# Patient Record
Sex: Male | Born: 1965
Health system: Southern US, Community
[De-identification: ages and names within clinical notes are randomized; demographics above are authoritative.]

## PROBLEM LIST (undated history)

## (undated) DIAGNOSIS — N410 Acute prostatitis: Secondary | ICD-10-CM

## (undated) DIAGNOSIS — E785 Hyperlipidemia, unspecified: Secondary | ICD-10-CM

## (undated) HISTORY — DX: Hyperlipidemia, unspecified: E78.5

## (undated) HISTORY — PX: NO PAST SURGERIES: SHX2092

## (undated) HISTORY — DX: Acute prostatitis: N41.0

---

## 2004-07-07 ENCOUNTER — Ambulatory Visit: Payer: Self-pay | Admitting: Family Medicine

## 2005-02-23 ENCOUNTER — Ambulatory Visit: Payer: Self-pay | Admitting: Family Medicine

## 2005-04-27 ENCOUNTER — Ambulatory Visit: Payer: Self-pay | Admitting: Family Medicine

## 2005-05-08 ENCOUNTER — Ambulatory Visit: Payer: Self-pay | Admitting: Family Medicine

## 2006-07-26 ENCOUNTER — Ambulatory Visit: Payer: Self-pay | Admitting: Family Medicine

## 2006-09-12 DIAGNOSIS — E785 Hyperlipidemia, unspecified: Secondary | ICD-10-CM

## 2006-09-12 HISTORY — DX: Hyperlipidemia, unspecified: E78.5

## 2006-10-04 ENCOUNTER — Ambulatory Visit: Payer: Self-pay | Admitting: Family Medicine

## 2006-10-04 LAB — CONVERTED CEMR LAB
ALT: 41 units/L (ref 0–53)
AST: 29 units/L (ref 0–37)
Basophils Relative: 0 % (ref 0.0–1.0)
Bilirubin, Direct: 0.1 mg/dL (ref 0.0–0.3)
CO2: 27 meq/L (ref 19–32)
Calcium: 8.9 mg/dL (ref 8.4–10.5)
Chloride: 108 meq/L (ref 96–112)
Creatinine, Ser: 1 mg/dL (ref 0.4–1.5)
Direct LDL: 182.6 mg/dL
Eosinophils Absolute: 0.1 10*3/uL (ref 0.0–0.6)
Eosinophils Relative: 2.2 % (ref 0.0–5.0)
GFR calc non Af Amer: 88 mL/min
Glucose, Bld: 97 mg/dL (ref 70–99)
HCT: 40.9 % (ref 39.0–52.0)
HDL: 37.8 mg/dL — ABNORMAL LOW (ref >39.0)
MCV: 84.9 fL (ref 78.0–100.0)
Neutrophils Relative %: 46.8 % (ref 43.0–77.0)
Nitrite: NEGATIVE
Platelets: 274 10*3/uL (ref 150–400)
RBC: 4.82 M/uL (ref 4.22–5.81)
Sodium: 142 meq/L (ref 135–145)
Specific Gravity, Urine: 1.015
Total Bilirubin: 0.8 mg/dL (ref 0.3–1.2)
Total Protein: 6.6 g/dL (ref 6.0–8.3)
Triglycerides: 132 mg/dL (ref 0–149)
Urobilinogen, UA: 0.2
VLDL: 26 mg/dL (ref 0–40)
WBC Urine, dipstick: NEGATIVE
WBC: 6.8 10*3/uL (ref 4.5–10.5)

## 2006-10-11 ENCOUNTER — Ambulatory Visit: Payer: Self-pay | Admitting: Family Medicine

## 2006-10-11 DIAGNOSIS — R319 Hematuria, unspecified: Secondary | ICD-10-CM | POA: Insufficient documentation

## 2008-12-03 ENCOUNTER — Ambulatory Visit: Payer: Self-pay | Admitting: Family Medicine

## 2008-12-03 LAB — CONVERTED CEMR LAB
ALT: 23 units/L (ref 0–53)
Albumin: 4 g/dL (ref 3.5–5.2)
Alkaline Phosphatase: 48 units/L (ref 39–117)
Basophils Relative: 0.3 % (ref 0.0–3.0)
Bilirubin Urine: NEGATIVE
Bilirubin, Direct: 0.1 mg/dL (ref 0.0–0.3)
CO2: 28 meq/L (ref 19–32)
Calcium: 9.2 mg/dL (ref 8.4–10.5)
Chloride: 108 meq/L (ref 96–112)
Cholesterol: 266 mg/dL — ABNORMAL HIGH (ref 0–200)
Creatinine, Ser: 1.1 mg/dL (ref 0.4–1.5)
Eosinophils Relative: 2 % (ref 0.0–5.0)
Hemoglobin: 14.4 g/dL (ref 13.0–17.0)
Lymphocytes Relative: 46.5 % — ABNORMAL HIGH (ref 12.0–46.0)
MCV: 89.9 fL (ref 78.0–100.0)
Neutro Abs: 2.2 10*3/uL (ref 1.4–7.7)
Neutrophils Relative %: 42.8 % — ABNORMAL LOW (ref 43.0–77.0)
Nitrite: NEGATIVE
Protein, U semiquant: NEGATIVE
RBC: 4.83 M/uL (ref 4.22–5.81)
Sodium: 144 meq/L (ref 135–145)
Total CHOL/HDL Ratio: 4
Total Protein: 7.1 g/dL (ref 6.0–8.3)
Urobilinogen, UA: 0.2
VLDL: 14.6 mg/dL (ref 0.0–40.0)
WBC: 5.2 10*3/uL (ref 4.5–10.5)

## 2008-12-17 ENCOUNTER — Ambulatory Visit: Payer: Self-pay | Admitting: Family Medicine

## 2011-06-18 ENCOUNTER — Ambulatory Visit (INDEPENDENT_AMBULATORY_CARE_PROVIDER_SITE_OTHER): Payer: BC Managed Care – PPO | Admitting: Family Medicine

## 2011-06-18 ENCOUNTER — Encounter: Payer: Self-pay | Admitting: Family Medicine

## 2011-06-18 VITALS — BP 110/74 | Temp 98.8°F | Ht 60.0 in | Wt 136.0 lb

## 2011-06-18 DIAGNOSIS — R300 Dysuria: Secondary | ICD-10-CM

## 2011-06-18 DIAGNOSIS — N410 Acute prostatitis: Secondary | ICD-10-CM

## 2011-06-18 HISTORY — DX: Acute prostatitis: N41.0

## 2011-06-18 LAB — POCT URINALYSIS DIPSTICK
Bilirubin, UA: NEGATIVE
Ketones, UA: NEGATIVE
Spec Grav, UA: >=1.03
pH, UA: 6

## 2011-06-18 MED ORDER — CIPROFLOXACIN HCL 500 MG PO TABS: 500.0000 mg | ORAL_TABLET | Freq: Two times a day (BID) | ORAL | Status: AC

## 2011-06-18 NOTE — Patient Instructions (Signed)
Drink lots of water  Take the Cipro 1 twice daily until bottle empty refills x1

## 2011-06-18 NOTE — Progress Notes (Signed)
  Subjective:    Patient ID: Curtis Franklin, male    DOB: [birth year], 46 y.o.   MRN: [id number]  HPI Curtis Franklin is a 46 year old male who comes in today for evaluation of fever chills and dysuria  He states he felt well until last Friday when he began having diffuse abdominal pain fever and chills. On Saturday he began having frequency and burning on urination. Review of systems otherwise negative he did have urinary tract infection but it's been 10-15 years ago. No discharge no testicular pain   Review of Systems General and urologic review of systems negative    Objective:   Physical Exam Well-developed well-nourished male in no acute distress examination the abdomen is normal  Urinalysis shows white cells and bacteria consistent with acute prostatitis       Assessment & Plan:  Acute prostatitis plan Cipro 500 twice a day for 10 days

## 2011-06-22 ENCOUNTER — Telehealth: Payer: Self-pay | Admitting: Family Medicine

## 2011-06-22 NOTE — Telephone Encounter (Signed)
Spoke to Curtis Franklin about headaches. Told him headaches are a possible side effect of Cipro, he stated that the headaches are not bad and they come and go but not all the time. Curtis Franklin is not taking any other medications. I told him I could schedule him an appointment with Dr. Tawanna Cooler, next week. He said he will wait and see how he feels once he finished the Cipro.

## 2011-06-22 NOTE — Telephone Encounter (Signed)
Pt was in to see Dr Tawanna Cooler re: uti and was prescribed a med. UTI has improved, but now pt is having periodic headaches. Req call back from nurse.

## 2011-07-18 ENCOUNTER — Encounter: Payer: Self-pay | Admitting: Family Medicine

## 2011-07-18 ENCOUNTER — Ambulatory Visit (INDEPENDENT_AMBULATORY_CARE_PROVIDER_SITE_OTHER): Payer: BC Managed Care – PPO | Admitting: Family Medicine

## 2011-07-18 VITALS — BP 130/68 | Temp 98.4°F | Wt 134.0 lb

## 2011-07-18 DIAGNOSIS — R300 Dysuria: Secondary | ICD-10-CM

## 2011-07-18 LAB — POCT URINALYSIS DIPSTICK
Bilirubin, UA: NEGATIVE
Glucose, UA: NEGATIVE
Ketones, UA: NEGATIVE
Nitrite, UA: NEGATIVE
Spec Grav, UA: >=1.03

## 2011-07-18 MED ORDER — CEPHALEXIN 500 MG PO CAPS: 500.0000 mg | ORAL_CAPSULE | Freq: Three times a day (TID) | ORAL | Status: AC

## 2011-07-18 NOTE — Patient Instructions (Addendum)

## 2011-07-18 NOTE — Progress Notes (Signed)
  Subjective:    Patient ID: Curtis Franklin, male    DOB: [birth year], 46 y.o.   MRN: [id number]  HPI  Recent UTI.  On Cipro. Symptoms improved but never fully went away. He is monogamous. No penile discharge. Urine frequency and mild burning. No fever. No back pain. No nausea or vomiting. He also relates somewhat slow urinary stream even prior to this symptom but no severe obstruction. No prior history of recurrent UTI. No known drug allergies.   Review of Systems  Constitutional: Negative for fever, chills, appetite change and unexpected weight change.  Genitourinary: Positive for dysuria. Negative for hematuria and testicular pain.       Objective:   Physical Exam  Constitutional: He appears well-developed and well-nourished.  Cardiovascular: Normal rate and regular rhythm.   Pulmonary/Chest: Effort normal and breath sounds normal. No respiratory distress. He has no wheezes. He has no rales.          Assessment & Plan:  Dysuria. Question recurrent UTI.  Urine cx sent.  Keflex pending culture.  No risk factors for STD.

## 2011-07-21 LAB — URINE CULTURE: Colony Count: >=100000

## 2011-07-23 NOTE — Progress Notes (Signed)
Quick Note:  Pt informed ______ 

## 2011-08-03 ENCOUNTER — Encounter: Payer: Self-pay | Admitting: Family Medicine

## 2011-08-03 ENCOUNTER — Ambulatory Visit (INDEPENDENT_AMBULATORY_CARE_PROVIDER_SITE_OTHER): Payer: BC Managed Care – PPO | Admitting: Family Medicine

## 2011-08-03 VITALS — BP 130/84 | Temp 98.4°F | Wt 133.0 lb

## 2011-08-03 DIAGNOSIS — R319 Hematuria, unspecified: Secondary | ICD-10-CM

## 2011-08-03 DIAGNOSIS — R300 Dysuria: Secondary | ICD-10-CM

## 2011-08-03 LAB — POCT URINALYSIS DIPSTICK
Bilirubin, UA: NEGATIVE
Glucose, UA: NEGATIVE
Leukocytes, UA: NEGATIVE
Nitrite, UA: NEGATIVE

## 2011-08-03 LAB — PSA: PSA: 7.59 ng/mL — ABNORMAL HIGH (ref 0.10–4.00)

## 2011-08-03 NOTE — Progress Notes (Signed)
  Subjective:    Patient ID: Curtis Franklin, male    DOB: 11-Jun-1965, 46 y.o.   MRN: [id number]  HPI  Patient seen for followup UTI.  Grew out Escherichia coli. Susceptible to Keflex. Patient completed antibiotics. No fever or chills. No dysuria this time other than his had some chronic slightly slow stream -preceding UTI. Denies any history of urethritis. We discussed possible PSA testing because of chronic slow stream intermittently.   Review of Systems  Constitutional: Negative for fever and chills.  Genitourinary: Positive for dysuria. Negative for hematuria.       Objective:   Physical Exam  Constitutional: He appears well-developed and well-nourished.  Cardiovascular: Normal rate and regular rhythm.   Pulmonary/Chest: Effort normal and breath sounds normal. No respiratory distress. He has no wheezes. He has no rales.          Assessment & Plan:  Dysuria. Recent UTI. Those acute symptoms have improved. Recheck urinalysis. He has had chronic slightly slow stream. Patient refuses prostate digital exam. PSA obtained per his request though explained complete examination should include digital exam. If PSA normal no further evaluation unless symptoms progress  Repeat urine dip still shows blood.  Urine micro and if > 3 RBCs per HPF would recommend urology referral.

## 2011-08-03 NOTE — Patient Instructions (Addendum)
Followup immediately for any fever or recurrent burning with urination

## 2011-08-04 LAB — URINALYSIS, MICROSCOPIC ONLY
Crystals: NONE SEEN
Squamous Epithelial / LPF: NONE SEEN

## 2011-08-10 ENCOUNTER — Ambulatory Visit: Payer: BC Managed Care – PPO | Admitting: Family Medicine

## 2011-09-07 ENCOUNTER — Other Ambulatory Visit (INDEPENDENT_AMBULATORY_CARE_PROVIDER_SITE_OTHER): Payer: BC Managed Care – PPO

## 2011-09-07 DIAGNOSIS — R972 Elevated prostate specific antigen [PSA]: Secondary | ICD-10-CM

## 2011-09-14 ENCOUNTER — Encounter: Payer: Self-pay | Admitting: Family Medicine

## 2011-09-14 ENCOUNTER — Telehealth: Payer: Self-pay | Admitting: *Deleted

## 2011-09-14 ENCOUNTER — Ambulatory Visit (INDEPENDENT_AMBULATORY_CARE_PROVIDER_SITE_OTHER): Payer: BC Managed Care – PPO | Admitting: Family Medicine

## 2011-09-14 VITALS — BP 126/70 | Temp 98.4°F | Wt 132.0 lb

## 2011-09-14 DIAGNOSIS — R972 Elevated prostate specific antigen [PSA]: Secondary | ICD-10-CM

## 2011-09-14 NOTE — Telephone Encounter (Signed)
Pt requesting change PCP due to scheduling conflict with Dr Tawanna Cooler.  Pt has Fridays off work and convenient for him to make appts. Pt requesting Dr Caryl Never as PCP

## 2011-09-14 NOTE — Progress Notes (Signed)
  Subjective:    Patient ID: Curtis Franklin, male    DOB: Aug 06, 1965, 46 y.o.   MRN: [id number]  HPI  Patient seen for followup. He travels Monday through Thursday and is only here in town Fridays which is the day his primary care physician is off.  Recent issues with dysuria. Treated with Cipro. Had small blood on urine dipstick. Subsequent urine micro-revealed no significant red blood cells. Patient requested PSA and this came back initially high 7.5. Repeat one month later 4.3 which suggests that he may have had recent prostatitis. No urinary symptoms at this time. No gross hematuria. No burning. No fever. No chills.   Review of Systems  Constitutional: Negative for appetite change and unexpected weight change.  Genitourinary: Negative for dysuria and hematuria.  Musculoskeletal: Negative for back pain.       Objective:   Physical Exam  Constitutional: He appears well-developed and well-nourished.  Cardiovascular: Normal rate and regular rhythm.   Genitourinary: Rectum normal and prostate normal.          Assessment & Plan:  #1 recent small hematuria on urine dipstick with subsequent negative urine micro-. Symptomatically improved following Cipro #2 elevated PSA. Question recent prostatitis. Repeat PSA still elevated but down. We have suggested complete physical in 3 months and repeat PSA once more that time. Patient requesting Friday appointment because of conflict with work schedule.  Never in town Monday to Thursday.  This is why he has not seen his primary last 3 visits.

## 2011-09-19 NOTE — Telephone Encounter (Signed)
ok 

## 2012-05-30 ENCOUNTER — Other Ambulatory Visit (INDEPENDENT_AMBULATORY_CARE_PROVIDER_SITE_OTHER): Payer: BC Managed Care – PPO

## 2012-05-30 DIAGNOSIS — Z0000 Encounter for general adult medical examination without abnormal findings: Secondary | ICD-10-CM

## 2012-05-30 LAB — LIPID PANEL
HDL: 53.3 mg/dL (ref >39.00)
Triglycerides: 176 mg/dL — ABNORMAL HIGH (ref 0.0–149.0)

## 2012-05-30 LAB — BASIC METABOLIC PANEL
BUN: 13 mg/dL (ref 6–23)
CO2: 26 mEq/L (ref 19–32)
Chloride: 106 mEq/L (ref 96–112)
Glucose, Bld: 87 mg/dL (ref 70–99)
Potassium: 4.4 mEq/L (ref 3.5–5.1)

## 2012-05-30 LAB — LDL CHOLESTEROL, DIRECT: Direct LDL: 165.9 mg/dL

## 2012-05-30 LAB — POCT URINALYSIS DIPSTICK
Bilirubin, UA: NEGATIVE
Glucose, UA: NEGATIVE
Nitrite, UA: NEGATIVE
Urobilinogen, UA: 0.2
pH, UA: 8

## 2012-05-30 LAB — TSH: TSH: 1.24 u[IU]/mL (ref 0.35–5.50)

## 2012-05-30 LAB — CBC WITH DIFFERENTIAL/PLATELET
Basophils Absolute: 0 10*3/uL (ref 0.0–0.1)
HCT: 43.9 % (ref 39.0–52.0)
Lymphs Abs: 2.8 10*3/uL (ref 0.7–4.0)
MCHC: 33.6 g/dL (ref 30.0–36.0)
MCV: 86.1 fl (ref 78.0–100.0)
Monocytes Absolute: 0.6 10*3/uL (ref 0.1–1.0)
Platelets: 217 10*3/uL (ref 150.0–400.0)
RDW: 12.9 % (ref 11.5–14.6)

## 2012-05-30 LAB — HEPATIC FUNCTION PANEL
Albumin: 3.7 g/dL (ref 3.5–5.2)
Total Bilirubin: 0.5 mg/dL (ref 0.3–1.2)

## 2012-06-06 ENCOUNTER — Ambulatory Visit (INDEPENDENT_AMBULATORY_CARE_PROVIDER_SITE_OTHER): Payer: BC Managed Care – PPO | Admitting: Family Medicine

## 2012-06-06 ENCOUNTER — Encounter: Payer: Self-pay | Admitting: Family Medicine

## 2012-06-06 VITALS — BP 130/80 | Temp 98.9°F | Ht 60.0 in | Wt 140.0 lb

## 2012-06-06 DIAGNOSIS — Z0000 Encounter for general adult medical examination without abnormal findings: Secondary | ICD-10-CM

## 2012-06-06 NOTE — Progress Notes (Signed)
  Subjective:    Patient ID: Curtis Franklin, male    DOB: 1965/05/23, 47 y.o.   MRN: [id number]  HPI Patient is here for complete physical. Generally very healthy. Still runs few days per week. Takes no chronic medications. Tetanus 2005. Prior history of acute prostatitis. No recent urinary symptoms. No specific complaints. Patient nonsmoker. Does have history of hyperlipidemia but overall low risk for CAD. Previously on statin but he took himself off  Past Medical History  Diagnosis Date  . HYPERLIPIDEMIA 09/12/2006    Qualifier: Diagnosis of  By: Everett Graff    . Prostatitis, acute 06/18/2011   No past surgical history on file.  reports that he has quit smoking. He does not have any smokeless tobacco history on file. He reports that  drinks alcohol. He reports that he does not use illicit drugs. family history includes Hyperlipidemia in his other. Not on File    Review of Systems  Constitutional: Negative for fever, activity change, appetite change, fatigue and unexpected weight change.  HENT: Negative for ear pain, congestion and trouble swallowing.   Eyes: Negative for pain and visual disturbance.  Respiratory: Negative for cough, shortness of breath and wheezing.   Cardiovascular: Negative for chest pain and palpitations.  Gastrointestinal: Negative for nausea, vomiting, abdominal pain, diarrhea, constipation, blood in stool, abdominal distention and rectal pain.  Genitourinary: Negative for dysuria, hematuria and testicular pain.  Musculoskeletal: Negative for joint swelling and arthralgias.  Skin: Negative for rash.  Neurological: Negative for dizziness, syncope and headaches.  Hematological: Negative for adenopathy.  Psychiatric/Behavioral: Negative for confusion and dysphoric mood.       Objective:   Physical Exam  Constitutional: He is oriented to person, place, and time. He appears well-developed and well-nourished. No distress.  HENT:  Head: Normocephalic  and atraumatic.  Right Ear: External ear normal.  Left Ear: External ear normal.  Mouth/Throat: Oropharynx is clear and moist.  Eyes: Conjunctivae and EOM are normal. Pupils are equal, round, and reactive to light.  Neck: Normal range of motion. Neck supple. No thyromegaly present.  Cardiovascular: Normal rate, regular rhythm and normal heart sounds.   No murmur heard. Pulmonary/Chest: No respiratory distress. He has no wheezes. He has no rales.  Abdominal: Soft. Bowel sounds are normal. He exhibits no distension and no mass. There is no tenderness. There is no rebound and no guarding.  Musculoskeletal: He exhibits no edema.  Lymphadenopathy:    He has no cervical adenopathy.  Neurological: He is alert and oriented to person, place, and time. He displays normal reflexes. No cranial nerve deficit.  Skin: No rash noted.  Psychiatric: He has a normal mood and affect.          Assessment & Plan:  Physical exam. Labs reviewed. He has elevated lipids and we discussed dietary modification. Continue regular exercise. Tetanus booster next year.

## 2013-12-15 ENCOUNTER — Other Ambulatory Visit (INDEPENDENT_AMBULATORY_CARE_PROVIDER_SITE_OTHER): Payer: 59

## 2013-12-15 DIAGNOSIS — E785 Hyperlipidemia, unspecified: Secondary | ICD-10-CM

## 2013-12-15 DIAGNOSIS — Z0000 Encounter for general adult medical examination without abnormal findings: Secondary | ICD-10-CM

## 2013-12-15 LAB — POCT URINALYSIS DIPSTICK
BILIRUBIN UA: NEGATIVE
Glucose, UA: NEGATIVE
Ketones, UA: NEGATIVE
Leukocytes, UA: NEGATIVE
NITRITE UA: NEGATIVE
PH UA: 5.5
Protein, UA: NEGATIVE
Urobilinogen, UA: 0.2

## 2013-12-15 LAB — CBC WITH DIFFERENTIAL/PLATELET
BASOS ABS: 0 10*3/uL (ref 0.0–0.1)
BASOS PCT: 0.6 % (ref 0.0–3.0)
Eosinophils Absolute: 0.1 10*3/uL (ref 0.0–0.7)
Eosinophils Relative: 1.5 % (ref 0.0–5.0)
HCT: 45 % (ref 39.0–52.0)
HEMOGLOBIN: 14.6 g/dL (ref 13.0–17.0)
LYMPHS PCT: 36.8 % (ref 12.0–46.0)
Lymphs Abs: 2.7 10*3/uL (ref 0.7–4.0)
MCHC: 32.4 g/dL (ref 30.0–36.0)
MCV: 87.3 fl (ref 78.0–100.0)
MONOS PCT: 9.6 % (ref 3.0–12.0)
Monocytes Absolute: 0.7 10*3/uL (ref 0.1–1.0)
NEUTROS ABS: 3.8 10*3/uL (ref 1.4–7.7)
Neutrophils Relative %: 51.5 % (ref 43.0–77.0)
Platelets: 233 10*3/uL (ref 150.0–400.0)
RBC: 5.15 Mil/uL (ref 4.22–5.81)
RDW: 13.3 % (ref 11.5–15.5)
WBC: 7.4 10*3/uL (ref 4.0–10.5)

## 2013-12-15 LAB — TSH: TSH: 2.83 u[IU]/mL (ref 0.35–4.50)

## 2013-12-16 LAB — COMPREHENSIVE METABOLIC PANEL
ALT: 38 U/L (ref 0–53)
AST: 36 U/L (ref 0–37)
Albumin: 4.1 g/dL (ref 3.5–5.2)
Alkaline Phosphatase: 56 U/L (ref 39–117)
BUN: 13 mg/dL (ref 6–23)
CALCIUM: 9.2 mg/dL (ref 8.4–10.5)
CHLORIDE: 104 meq/L (ref 96–112)
CO2: 19 meq/L (ref 19–32)
CREATININE: 1.1 mg/dL (ref 0.4–1.5)
GFR: 74.56 mL/min (ref >60.00)
GLUCOSE: 74 mg/dL (ref 70–99)
Potassium: 4.3 mEq/L (ref 3.5–5.1)
Sodium: 135 mEq/L (ref 135–145)
Total Bilirubin: 0.9 mg/dL (ref 0.2–1.2)
Total Protein: 7.4 g/dL (ref 6.0–8.3)

## 2013-12-16 LAB — LIPID PANEL
Cholesterol: 312 mg/dL — ABNORMAL HIGH (ref 0–200)
HDL: 50.1 mg/dL
NonHDL: 261.9
Total CHOL/HDL Ratio: 6
Triglycerides: 238 mg/dL — ABNORMAL HIGH (ref 0.0–149.0)
VLDL: 47.6 mg/dL — ABNORMAL HIGH (ref 0.0–40.0)

## 2013-12-17 LAB — LDL CHOLESTEROL, DIRECT: Direct LDL: 206.7 mg/dL

## 2013-12-24 ENCOUNTER — Ambulatory Visit (INDEPENDENT_AMBULATORY_CARE_PROVIDER_SITE_OTHER): Payer: 59 | Admitting: Family Medicine

## 2013-12-24 ENCOUNTER — Encounter: Payer: Self-pay | Admitting: Family Medicine

## 2013-12-24 VITALS — BP 120/76 | HR 78 | Temp 98.0°F | Ht 59.0 in | Wt 146.0 lb

## 2013-12-24 DIAGNOSIS — R319 Hematuria, unspecified: Secondary | ICD-10-CM

## 2013-12-24 DIAGNOSIS — Z23 Encounter for immunization: Secondary | ICD-10-CM

## 2013-12-24 DIAGNOSIS — Z0000 Encounter for general adult medical examination without abnormal findings: Secondary | ICD-10-CM

## 2013-12-24 LAB — POCT URINALYSIS DIPSTICK
BILIRUBIN UA: NEGATIVE
GLUCOSE UA: NEGATIVE
KETONES UA: NEGATIVE
Leukocytes, UA: NEGATIVE
Nitrite, UA: NEGATIVE
Protein, UA: NEGATIVE
SPEC GRAV UA: 1.01
Urobilinogen, UA: 0.2
pH, UA: 5

## 2013-12-24 LAB — URINALYSIS, MICROSCOPIC ONLY

## 2013-12-24 NOTE — Patient Instructions (Signed)
Fat and Cholesterol Control Diet Fat and cholesterol levels in your blood and organs are influenced by your diet. High levels of fat and cholesterol may lead to diseases of the heart, small and large blood vessels, gallbladder, liver, and pancreas. CONTROLLING FAT AND CHOLESTEROL WITH DIET Although exercise and lifestyle factors are important, your diet is key. That is because certain foods are known to raise cholesterol and others to lower it. The goal is to balance foods for their effect on cholesterol and more importantly, to replace saturated and trans fat with other types of fat, such as monounsaturated fat, polyunsaturated fat, and omega-3 fatty acids. On average, a person should consume no more than 15 to 17 g of saturated fat daily. Saturated and trans fats are considered "bad" fats, and they will raise LDL cholesterol. Saturated fats are primarily found in animal products such as meats, butter, and cream. However, that does not mean you need to give up all your favorite foods. Today, there are good tasting, low-fat, low-cholesterol substitutes for most of the things you like to eat. Choose low-fat or nonfat alternatives. Choose round or loin cuts of red meat. These types of cuts are lowest in fat and cholesterol. Chicken (without the skin), fish, veal, and ground turkey breast are great choices. Eliminate fatty meats, such as hot dogs and salami. Even shellfish have little or no saturated fat. Have a 3 oz (85 g) portion when you eat lean meat, poultry, or fish. Trans fats are also called "partially hydrogenated oils." They are oils that have been scientifically manipulated so that they are solid at room temperature resulting in a longer shelf life and improved taste and texture of foods in which they are added. Trans fats are found in stick margarine, some tub margarines, cookies, crackers, and baked goods.  When baking and cooking, oils are a great substitute for butter. The monounsaturated oils are  especially beneficial since it is believed they lower LDL and raise HDL. The oils you should avoid entirely are saturated tropical oils, such as coconut and palm.  Remember to eat a lot from food groups that are naturally free of saturated and trans fat, including fish, fruit, vegetables, beans, grains (barley, rice, couscous, bulgur wheat), and pasta (without cream sauces).  IDENTIFYING FOODS THAT LOWER FAT AND CHOLESTEROL  Soluble fiber may lower your cholesterol. This type of fiber is found in fruits such as apples, vegetables such as broccoli, potatoes, and carrots, legumes such as beans, peas, and lentils, and grains such as barley. Foods fortified with plant sterols (phytosterol) may also lower cholesterol. You should eat at least 2 g per day of these foods for a cholesterol lowering effect.  Read package labels to identify low-saturated fats, trans fat free, and low-fat foods at the supermarket. Select cheeses that have only 2 to 3 g saturated fat per ounce. Use a heart-healthy tub margarine that is free of trans fats or partially hydrogenated oil. When buying baked goods (cookies, crackers), avoid partially hydrogenated oils. Breads and muffins should be made from whole grains (whole-wheat or whole oat flour, instead of "flour" or "enriched flour"). Buy non-creamy canned soups with reduced salt and no added fats.  FOOD PREPARATION TECHNIQUES  Never deep-fry. If you must fry, either stir-fry, which uses very little fat, or use non-stick cooking sprays. When possible, broil, bake, or roast meats, and steam vegetables. Instead of putting butter or margarine on vegetables, use lemon and herbs, applesauce, and cinnamon (for squash and sweet potatoes). Use nonfat   yogurt, salsa, and low-fat dressings for salads.  LOW-SATURATED FAT / LOW-FAT FOOD SUBSTITUTES Meats / Saturated Fat (g)  Avoid: Steak, marbled (3 oz/85 g) / 11 g  Choose: Steak, lean (3 oz/85 g) / 4 g  Avoid: Hamburger (3 oz/85 g) / 7  g  Choose: Hamburger, lean (3 oz/85 g) / 5 g  Avoid: Ham (3 oz/85 g) / 6 g  Choose: Ham, lean cut (3 oz/85 g) / 2.4 g  Avoid: Chicken, with skin, dark meat (3 oz/85 g) / 4 g  Choose: Chicken, skin removed, dark meat (3 oz/85 g) / 2 g  Avoid: Chicken, with skin, light meat (3 oz/85 g) / 2.5 g  Choose: Chicken, skin removed, light meat (3 oz/85 g) / 1 g Dairy / Saturated Fat (g)  Avoid: Whole milk (1 cup) / 5 g  Choose: Low-fat milk, 2% (1 cup) / 3 g  Choose: Low-fat milk, 1% (1 cup) / 1.5 g  Choose: Skim milk (1 cup) / 0.3 g  Avoid: Hard cheese (1 oz/28 g) / 6 g  Choose: Skim milk cheese (1 oz/28 g) / 2 to 3 g  Avoid: Cottage cheese, 4% fat (1 cup) / 6.5 g  Choose: Low-fat cottage cheese, 1% fat (1 cup) / 1.5 g  Avoid: Ice cream (1 cup) / 9 g  Choose: Sherbet (1 cup) / 2.5 g  Choose: Nonfat frozen yogurt (1 cup) / 0.3 g  Choose: Frozen fruit bar / trace  Avoid: Whipped cream (1 tbs) / 3.5 g  Choose: Nondairy whipped topping (1 tbs) / 1 g Condiments / Saturated Fat (g)  Avoid: Mayonnaise (1 tbs) / 2 g  Choose: Low-fat mayonnaise (1 tbs) / 1 g  Avoid: Butter (1 tbs) / 7 g  Choose: Extra light margarine (1 tbs) / 1 g  Avoid: Coconut oil (1 tbs) / 11.8 g  Choose: Olive oil (1 tbs) / 1.8 g  Choose: Corn oil (1 tbs) / 1.7 g  Choose: Safflower oil (1 tbs) / 1.2 g  Choose: Sunflower oil (1 tbs) / 1.4 g  Choose: Soybean oil (1 tbs) / 2.4 g  Choose: Canola oil (1 tbs) / 1 g Document Released: 01/01/2005 Document Revised: 04/28/2012 Document Reviewed: 04/01/2013 [employer] Patient Information 2015 [employer]. This information is not intended to replace advice given to you by your health care provider. Make sure you discuss any questions you have with your health care provider.  Consider repeat fasting lipids in about 6 months.

## 2013-12-24 NOTE — Progress Notes (Signed)
   Subjective:    Patient ID: Curtis Franklin, male    DOB: 07/12/1965, 48 y.o.   MRN: [id number]  HPI Patient seen for complete physical. He has history of acute prostatitis and hyperlipidemia. He is also had prior history of microscopic hematuria. He takes no medications. Last tetanus was 10 years ago. He declines flu vaccine. Nonsmoker. He has not been exercising much over the past 6 months has had poor compliance with diet at times. No family history of premature CAD and no family history of cancer.  Past Medical History  Diagnosis Date  . HYPERLIPIDEMIA 09/12/2006    Qualifier: Diagnosis of  By: Everett Graffodd, RN, Ellen    . Prostatitis, acute 06/18/2011   No past surgical history on file.  reports that he has quit smoking. He does not have any smokeless tobacco history on file. He reports that he drinks alcohol. He reports that he does not use illicit drugs. family history includes Hyperlipidemia in his other. Not on File    Review of Systems  Constitutional: Negative for fever, activity change, appetite change and fatigue.  HENT: Negative for congestion, ear pain and trouble swallowing.   Eyes: Negative for pain and visual disturbance.  Respiratory: Negative for cough, shortness of breath and wheezing.   Cardiovascular: Negative for chest pain and palpitations.  Gastrointestinal: Negative for nausea, vomiting, abdominal pain, diarrhea, constipation, blood in stool, abdominal distention and rectal pain.  Genitourinary: Negative for dysuria, hematuria and testicular pain.  Musculoskeletal: Negative for joint swelling and arthralgias.  Skin: Negative for rash.  Neurological: Negative for dizziness, syncope and headaches.  Hematological: Negative for adenopathy.  Psychiatric/Behavioral: Negative for confusion and dysphoric mood.       Objective:   Physical Exam  Constitutional: He is oriented to person, place, and time. He appears well-developed and well-nourished. No distress.  HENT:    Head: Normocephalic and atraumatic.  Right Ear: External ear normal.  Left Ear: External ear normal.  Mouth/Throat: Oropharynx is clear and moist.  Eyes: Conjunctivae and EOM are normal. Pupils are equal, round, and reactive to light.  Neck: Normal range of motion. Neck supple. No thyromegaly present.  Cardiovascular: Normal rate, regular rhythm and normal heart sounds.   No murmur heard. Pulmonary/Chest: No respiratory distress. He has no wheezes. He has no rales.  Abdominal: Soft. Bowel sounds are normal. He exhibits no distension and no mass. There is no tenderness. There is no rebound and no guarding.  Musculoskeletal: He exhibits no edema.  Lymphadenopathy:    He has no cervical adenopathy.  Neurological: He is alert and oriented to person, place, and time. He displays normal reflexes. No cranial nerve deficit.  Skin: No rash noted.  Psychiatric: He has a normal mood and affect.          Assessment & Plan:   Complete physical. Labs reviewed. He has fairly severe hyperlipidemia. He has 10 year risk of CAD of visit 8%. We discussed possible use of statin but he is reluctant. He prefers lifestyle modification and repeat fasting lipids in 6 months. Tetanus booster given. Flu vaccine recommended but declined. Establish more consistent exercise. Handout on low-cholesterol diet given. Does have trace blood on urine dipstick with repeat today of 1+. Send urine micro-.

## 2013-12-24 NOTE — Progress Notes (Signed)
Pre visit review using our clinic review tool, if applicable. No additional management support is needed unless otherwise documented below in the visit note. 

## 2013-12-24 NOTE — Addendum Note (Signed)
Addended by: Shelby DubinFLOYD, Curtis Franklin E on: 12/24/2013 02:50 PM   Modules accepted: Orders

## 2015-10-21 ENCOUNTER — Other Ambulatory Visit (INDEPENDENT_AMBULATORY_CARE_PROVIDER_SITE_OTHER): Payer: 59

## 2015-10-21 DIAGNOSIS — Z0000 Encounter for general adult medical examination without abnormal findings: Secondary | ICD-10-CM | POA: Diagnosis not present

## 2015-10-21 LAB — CBC WITH DIFFERENTIAL/PLATELET
Basophils Absolute: 0.1 10*3/uL (ref 0.0–0.1)
Basophils Relative: 0.8 % (ref 0.0–3.0)
Eosinophils Absolute: 0.1 10*3/uL (ref 0.0–0.7)
Eosinophils Relative: 2 % (ref 0.0–5.0)
HCT: 45.6 % (ref 39.0–52.0)
Hemoglobin: 15.1 g/dL (ref 13.0–17.0)
Lymphocytes Relative: 40.5 % (ref 12.0–46.0)
Lymphs Abs: 2.8 10*3/uL (ref 0.7–4.0)
MCHC: 33 g/dL (ref 30.0–36.0)
MCV: 86.9 fl (ref 78.0–100.0)
Monocytes Absolute: 0.6 10*3/uL (ref 0.1–1.0)
Monocytes Relative: 9 % (ref 3.0–12.0)
Neutro Abs: 3.3 10*3/uL (ref 1.4–7.7)
Neutrophils Relative %: 47.7 % (ref 43.0–77.0)
Platelets: 233 10*3/uL (ref 150.0–400.0)
RBC: 5.24 Mil/uL (ref 4.22–5.81)
RDW: 13.4 % (ref 11.5–15.5)
WBC: 7 10*3/uL (ref 4.0–10.5)

## 2015-10-21 LAB — LIPID PANEL
CHOLESTEROL: 294 mg/dL — AB (ref 0–200)
HDL: 59.9 mg/dL (ref >39.00)
LDL CALC: 202 mg/dL — AB (ref 0–99)
NonHDL: 234.44
TRIGLYCERIDES: 164 mg/dL — AB (ref 0.0–149.0)
Total CHOL/HDL Ratio: 5
VLDL: 32.8 mg/dL (ref 0.0–40.0)

## 2015-10-21 LAB — HEPATIC FUNCTION PANEL
ALT: 25 U/L (ref 0–53)
AST: 22 U/L (ref 0–37)
Albumin: 4.2 g/dL (ref 3.5–5.2)
Alkaline Phosphatase: 55 U/L (ref 39–117)
Bilirubin, Direct: 0.1 mg/dL (ref 0.0–0.3)
Total Bilirubin: 0.7 mg/dL (ref 0.2–1.2)
Total Protein: 7.3 g/dL (ref 6.0–8.3)

## 2015-10-21 LAB — BASIC METABOLIC PANEL
BUN: 14 mg/dL (ref 6–23)
CO2: 25 mEq/L (ref 19–32)
CREATININE: 1.07 mg/dL (ref 0.40–1.50)
Calcium: 9.5 mg/dL (ref 8.4–10.5)
Chloride: 103 mEq/L (ref 96–112)
GFR: 77.98 mL/min (ref >60.00)
Glucose, Bld: 95 mg/dL (ref 70–99)
POTASSIUM: 4.5 meq/L (ref 3.5–5.1)
SODIUM: 138 meq/L (ref 135–145)

## 2015-10-21 LAB — TSH: TSH: 2.18 u[IU]/mL (ref 0.35–4.50)

## 2015-10-28 ENCOUNTER — Ambulatory Visit (INDEPENDENT_AMBULATORY_CARE_PROVIDER_SITE_OTHER): Payer: 59 | Admitting: Family Medicine

## 2015-10-28 ENCOUNTER — Encounter: Payer: Self-pay | Admitting: Family Medicine

## 2015-10-28 VITALS — BP 110/70 | HR 64 | Temp 98.0°F | Ht 59.0 in | Wt 137.3 lb

## 2015-10-28 DIAGNOSIS — Z0000 Encounter for general adult medical examination without abnormal findings: Secondary | ICD-10-CM

## 2015-10-28 NOTE — Patient Instructions (Signed)
Fat and Cholesterol Restricted Diet  High levels of fat and cholesterol in your blood may lead to various health problems, such as diseases of the heart, blood vessels, gallbladder, liver, and pancreas. Fats are concentrated sources of energy that come in various forms. Certain types of fat, including saturated fat, may be harmful in excess. Cholesterol is a substance needed by your body in small amounts. Your body makes all the cholesterol it needs. Excess cholesterol comes from the food you eat.  When you have high levels of cholesterol and saturated fat in your blood, health problems can develop because the excess fat and cholesterol will gather along the walls of your blood vessels, causing them to narrow. Choosing the right foods will help you control your intake of fat and cholesterol. This will help keep the levels of these substances in your blood within normal limits and reduce your risk of disease.  WHAT IS MY PLAN?  Your health care provider recommends that you:  · Get no more than __________ % of the total calories in your daily diet from fat.  · Limit your intake of saturated fat to less than ______% of your total calories each day.  · Limit the amount of cholesterol in your diet to less than _________mg per day.  WHAT TYPES OF FAT SHOULD I CHOOSE?  · Choose healthy fats more often. Choose monounsaturated and polyunsaturated fats, such as olive and canola oil, flaxseeds, walnuts, almonds, and seeds.  · Eat more omega-3 fats. Good choices include salmon, mackerel, sardines, tuna, flaxseed oil, and ground flaxseeds. Aim to eat fish at least two times a week.  · Limit saturated fats. Saturated fats are primarily found in animal products, such as meats, butter, and cream. Plant sources of saturated fats include palm oil, palm kernel oil, and coconut oil.  · Avoid foods with partially hydrogenated oils in them. These contain trans fats. Examples of foods that contain trans fats are stick margarine, some tub  margarines, cookies, crackers, and other baked goods.  WHAT GENERAL GUIDELINES DO I NEED TO FOLLOW?  These guidelines for healthy eating will help you control your intake of fat and cholesterol:  · Check food labels carefully to identify foods with trans fats or high amounts of saturated fat.  · Fill one half of your plate with vegetables and green salads.  · Fill one fourth of your plate with whole grains. Look for the word "whole" as the first word in the ingredient list.  · Fill one fourth of your plate with lean protein foods.  · Limit fruit to two servings a day. Choose fruit instead of juice.  · Eat more foods that contain soluble fiber. Examples of foods that contain this type of fiber are apples, broccoli, carrots, beans, peas, and barley. Aim to get 20-30 g of fiber per day.  · Eat more home-cooked food and less restaurant, buffet, and fast food.  · Limit or avoid alcohol.  · Limit foods high in starch and sugar.  · Limit fried foods.  · Cook foods using methods other than frying. Baking, boiling, grilling, and broiling are all great options.  · Lose weight if you are overweight. Losing just 5-10% of your initial body weight can help your overall health and prevent diseases such as diabetes and heart disease.  WHAT FOODS CAN I EAT?  Grains  Whole grains, such as whole wheat or whole grain breads, crackers, cereals, and pasta. Unsweetened oatmeal, bulgur, barley, quinoa, or brown rice. Corn   or whole wheat flour tortillas.  Vegetables  Fresh or frozen vegetables (raw, steamed, roasted, or grilled). Green salads.  Fruits  All fresh, canned (in natural juice), or frozen fruits.  Meat and Other Protein Products  Ground beef (85% or leaner), grass-fed beef, or beef trimmed of fat. Skinless chicken or turkey. Ground chicken or turkey. Pork trimmed of fat. All fish and seafood. Eggs. Dried beans, peas, or lentils. Unsalted nuts or seeds. Unsalted canned or dry beans.  Dairy  Low-fat dairy products, such as skim or  1% milk, 2% or reduced-fat cheeses, low-fat ricotta or cottage cheese, or plain low-fat yogurt.  Fats and Oils  Tub margarines without trans fats. Light or reduced-fat mayonnaise and salad dressings. Avocado. Olive, canola, sesame, or safflower oils. Natural peanut or almond butter (choose ones without added sugar and oil).  The items listed above may not be a complete list of recommended foods or beverages. Contact your dietitian for more options.  WHAT FOODS ARE NOT RECOMMENDED?  Grains  White bread. White pasta. White rice. Cornbread. Bagels, pastries, and croissants. Crackers that contain trans fat.  Vegetables  White potatoes. Corn. Creamed or fried vegetables. Vegetables in a cheese sauce.  Fruits  Dried fruits. Canned fruit in light or heavy syrup. Fruit juice.  Meat and Other Protein Products  Fatty cuts of meat. Ribs, chicken wings, bacon, sausage, bologna, salami, chitterlings, fatback, hot dogs, bratwurst, and packaged luncheon meats. Liver and organ meats.  Dairy  Whole or 2% milk, cream, half-and-half, and cream cheese. Whole milk cheeses. Whole-fat or sweetened yogurt. Full-fat cheeses. Nondairy creamers and whipped toppings. Processed cheese, cheese spreads, or cheese curds.  Sweets and Desserts  Corn syrup, sugars, honey, and molasses. Candy. Jam and jelly. Syrup. Sweetened cereals. Cookies, pies, cakes, donuts, muffins, and ice cream.  Fats and Oils  Butter, stick margarine, lard, shortening, ghee, or bacon fat. Coconut, palm kernel, or palm oils.  Beverages  Alcohol. Sweetened drinks (such as sodas, lemonade, and fruit drinks or punches).  The items listed above may not be a complete list of foods and beverages to avoid. Contact your dietitian for more information.     This information is not intended to replace advice given to you by your health care provider. Make sure you discuss any questions you have with your health care provider.     Document Released: 01/01/2005 Document Revised: 01/22/2014  Document Reviewed: 04/01/2013  Elsevier Interactive Patient Education ©2016 [employer]  Food Choices to Lower Your Triglycerides  Triglycerides are a type of fat in your blood. High levels of triglycerides can increase the risk of heart disease and stroke. If your triglyceride levels are high, the foods you eat and your eating habits are very important. Choosing the right foods can help lower your triglycerides.   WHAT GENERAL GUIDELINES DO I NEED TO FOLLOW?  · Lose weight if you are overweight.    · Limit or avoid alcohol.    · Fill one half of your plate with vegetables and green salads.    · Limit fruit to two servings a day. Choose fruit instead of juice.    · Make one fourth of your plate whole grains. Look for the word "whole" as the first word in the ingredient list.  · Fill one fourth of your plate with lean protein foods.  · Enjoy fatty fish (such as salmon, mackerel, sardines, and tuna) three times a week.    · Choose healthy fats.    · Limit foods high in starch and sugar.  ·   Eat more home-cooked food and less restaurant, buffet, and fast food.  · Limit fried foods.  · Cook foods using methods other than frying.  · Limit saturated fats.  · Check ingredient lists to avoid foods with partially hydrogenated oils (trans fats) in them.  WHAT FOODS CAN I EAT?   Grains  Whole grains, such as whole wheat or whole grain breads, crackers, cereals, and pasta. Unsweetened oatmeal, bulgur, barley, quinoa, or brown rice. Corn or whole wheat flour tortillas.   Vegetables  Fresh or frozen vegetables (raw, steamed, roasted, or grilled). Green salads.  Fruits  All fresh, canned (in natural juice), or frozen fruits.  Meat and Other Protein Products  Ground beef (85% or leaner), grass-fed beef, or beef trimmed of fat. Skinless chicken or turkey. Ground chicken or turkey. Pork trimmed of fat. All fish and seafood. Eggs. Dried beans, peas, or lentils. Unsalted nuts or seeds. Unsalted canned or dry beans.  Dairy  Low-fat  dairy products, such as skim or 1% milk, 2% or reduced-fat cheeses, low-fat ricotta or cottage cheese, or plain low-fat yogurt.  Fats and Oils  Tub margarines without trans fats. Light or reduced-fat mayonnaise and salad dressings. Avocado. Safflower, olive, or canola oils. Natural peanut or almond butter.  The items listed above may not be a complete list of recommended foods or beverages. Contact your dietitian for more options.  WHAT FOODS ARE NOT RECOMMENDED?   Grains  White bread. White pasta. White rice. Cornbread. Bagels, pastries, and croissants. Crackers that contain trans fat.  Vegetables  White potatoes. Corn. Creamed or fried vegetables. Vegetables in a cheese sauce.  Fruits  Dried fruits. Canned fruit in light or heavy syrup. Fruit juice.  Meat and Other Protein Products  Fatty cuts of meat. Ribs, chicken wings, bacon, sausage, bologna, salami, chitterlings, fatback, hot dogs, bratwurst, and packaged luncheon meats.  Dairy  Whole or 2% milk, cream, half-and-half, and cream cheese. Whole-fat or sweetened yogurt. Full-fat cheeses. Nondairy creamers and whipped toppings. Processed cheese, cheese spreads, or cheese curds.  Sweets and Desserts  Corn syrup, sugars, honey, and molasses. Candy. Jam and jelly. Syrup. Sweetened cereals. Cookies, pies, cakes, donuts, muffins, and ice cream.  Fats and Oils  Butter, stick margarine, lard, shortening, ghee, or bacon fat. Coconut, palm kernel, or palm oils.  Beverages  Alcohol. Sweetened drinks (such as sodas, lemonade, and fruit drinks or punches).  The items listed above may not be a complete list of foods and beverages to avoid. Contact your dietitian for more information.     This information is not intended to replace advice given to you by your health care provider. Make sure you discuss any questions you have with your health care provider.     Document Released: 10/20/2003 Document Revised: 01/22/2014 Document Reviewed: 11/05/2012  Elsevier Interactive Patient  Education ©2016 Elsevier Inc.

## 2015-10-28 NOTE — Progress Notes (Signed)
Subjective:     Patient ID: Curtis MeyerRavi Curtis Franklin, male   DOB: 08/18/1965, 50 y.o.   MRN: [id number]  HPI Patient seen for physical exam. Takes no medications. Has long history of high cholesterol but overall low risk for CAD. He is training for half marathon. Runs several days per week. He declines flu vaccine. Tetanus up-to-date. Will turn 51 next year. Family history social history reviewed as below He smoked several years ago but not now for many years. Occasional alcohol use.  Past Medical History:  Diagnosis Date  . HYPERLIPIDEMIA 09/12/2006   Qualifier: Diagnosis of  By: Everett Graffodd, RN, Ellen    . Prostatitis, acute 06/18/2011   History reviewed. No pertinent surgical history.  reports that he has quit smoking. He has never used smokeless tobacco. He reports that he drinks alcohol. He reports that he does not use drugs. family history includes Diabetes in his maternal grandfather; Hyperlipidemia in his other. Not on File   Review of Systems  Constitutional: Negative for activity change, appetite change, fatigue and fever.  HENT: Negative for congestion, ear pain and trouble swallowing.   Eyes: Negative for pain and visual disturbance.  Respiratory: Negative for cough, shortness of breath and wheezing.   Cardiovascular: Negative for chest pain and palpitations.  Gastrointestinal: Negative for abdominal distention, abdominal pain, blood in stool, constipation, diarrhea, nausea, rectal pain and vomiting.  Genitourinary: Negative for dysuria, hematuria and testicular pain.  Musculoskeletal: Negative for arthralgias and joint swelling.  Skin: Negative for rash.  Neurological: Negative for dizziness, syncope and headaches.  Hematological: Negative for adenopathy.  Psychiatric/Behavioral: Negative for confusion and dysphoric mood.       Objective:   Physical Exam  Constitutional: He is oriented to person, place, and time. He appears well-developed and well-nourished. No distress.  HENT:   Head: Normocephalic and atraumatic.  Right Ear: External ear normal.  Left Ear: External ear normal.  Mouth/Throat: Oropharynx is clear and moist.  Eyes: Conjunctivae and EOM are normal. Pupils are equal, round, and reactive to light.  Neck: Normal range of motion. Neck supple. No thyromegaly present.  Cardiovascular: Normal rate, regular rhythm and normal heart sounds.   No murmur heard. Pulmonary/Chest: No respiratory distress. He has no wheezes. He has no rales.  Abdominal: Soft. Bowel sounds are normal. He exhibits no distension and no mass. There is no tenderness. There is no rebound and no guarding.  Musculoskeletal: He exhibits no edema.  Lymphadenopathy:    He has no cervical adenopathy.  Neurological: He is alert and oriented to person, place, and time. He displays normal reflexes. No cranial nerve deficit.  Skin: No rash noted.  Psychiatric: He has a normal mood and affect.       Assessment:     Physical exam. Labs reviewed. He has dyslipidemia but overall only 3.5% 10 year risk of CAD event    Plan:     -discuss lipids in some detail. Recommended low saturated fat diet and also discussed measures to reduce triglycerides. -Continue regular exercise habits -Flu vaccine offered and declined -He expressed some interest in lipoprotein profile and will consider next year with labs.  Kristian CoveyBruce W Jusiah Aguayo MD [facility]

## 2015-10-28 NOTE — Progress Notes (Signed)
Pre visit review using our clinic review tool, if applicable. No additional management support is needed unless otherwise documented below in the visit note. 

## 2016-12-10 ENCOUNTER — Telehealth: Payer: Self-pay | Admitting: Family Medicine

## 2016-12-10 NOTE — Telephone Encounter (Signed)
Left message on machine for patient to return our call 

## 2016-12-10 NOTE — Telephone Encounter (Signed)
Copied from CRM [phone]. Topic: General - Other >> Dec 10, 2016 10:21 AM Elliot GaultBell, Tiffany M wrote: Relation to pt: self  Call back number: Pharmacy:  Reason for call:  Patient traveling to [place] next week due to family emergency, patient will not return until the new year and would like he's physical conducted this week, patient would like to know if PCP can s

## 2016-12-10 NOTE — Telephone Encounter (Signed)
Ok to set up .  May use 2 acute slots if necessary.

## 2016-12-11 NOTE — Telephone Encounter (Signed)
Left message on machine for patient to return our call 

## 2016-12-12 ENCOUNTER — Encounter: Payer: 59 | Admitting: Family Medicine

## 2016-12-12 ENCOUNTER — Telehealth: Payer: Self-pay | Admitting: Family Medicine

## 2016-12-12 NOTE — Telephone Encounter (Signed)
Copied from CRM [phone]#[id number]. Topic: Quick Communication - See Telephone Encounter >> Dec 12, 2016  9:33 AM Herby AbrahamJohnson, Shiquita C wrote: CRM for notification. See Telephone encounter for:  12/12/16.    Pt called in at 9:30 to reschedule his apt, pt says that he is stuck at work and cant make his apt today. Pt rescheduled.    Should pt be charged?

## 2016-12-12 NOTE — Telephone Encounter (Signed)
Appointment made

## 2017-01-29 ENCOUNTER — Ambulatory Visit (INDEPENDENT_AMBULATORY_CARE_PROVIDER_SITE_OTHER): Payer: 59 | Admitting: Family Medicine

## 2017-01-29 ENCOUNTER — Encounter: Payer: Self-pay | Admitting: Family Medicine

## 2017-01-29 VITALS — BP 120/84 | HR 81 | Temp 98.3°F | Wt 143.3 lb

## 2017-01-29 DIAGNOSIS — Z0000 Encounter for general adult medical examination without abnormal findings: Secondary | ICD-10-CM

## 2017-01-29 LAB — HEPATIC FUNCTION PANEL
ALT: 37 U/L (ref 0–53)
AST: 28 U/L (ref 0–37)
Albumin: 4.1 g/dL (ref 3.5–5.2)
Alkaline Phosphatase: 60 U/L (ref 39–117)
BILIRUBIN TOTAL: 1 mg/dL (ref 0.2–1.2)
Bilirubin, Direct: 0.1 mg/dL (ref 0.0–0.3)
Total Protein: 7.2 g/dL (ref 6.0–8.3)

## 2017-01-29 LAB — LIPID PANEL
CHOLESTEROL: 306 mg/dL — AB (ref 0–200)
HDL: 59.1 mg/dL (ref >39.00)
LDL CALC: 209 mg/dL — AB (ref 0–99)
NonHDL: 246.51
TRIGLYCERIDES: 190 mg/dL — AB (ref 0.0–149.0)
Total CHOL/HDL Ratio: 5
VLDL: 38 mg/dL (ref 0.0–40.0)

## 2017-01-29 LAB — BASIC METABOLIC PANEL
BUN: 16 mg/dL (ref 6–23)
CHLORIDE: 103 meq/L (ref 96–112)
CO2: 24 mEq/L (ref 19–32)
CREATININE: 1.07 mg/dL (ref 0.40–1.50)
Calcium: 9.3 mg/dL (ref 8.4–10.5)
GFR: 77.58 mL/min (ref >60.00)
Glucose, Bld: 102 mg/dL — ABNORMAL HIGH (ref 70–99)
POTASSIUM: 4.3 meq/L (ref 3.5–5.1)
Sodium: 137 mEq/L (ref 135–145)

## 2017-01-29 LAB — PSA: PSA: 3.47 ng/mL (ref 0.10–4.00)

## 2017-01-29 LAB — CBC WITH DIFFERENTIAL/PLATELET
BASOS ABS: 0.1 10*3/uL (ref 0.0–0.1)
Basophils Relative: 1.1 % (ref 0.0–3.0)
EOS ABS: 0.1 10*3/uL (ref 0.0–0.7)
Eosinophils Relative: 2 % (ref 0.0–5.0)
HCT: 46.6 % (ref 39.0–52.0)
Hemoglobin: 15.4 g/dL (ref 13.0–17.0)
Lymphocytes Relative: 44.3 % (ref 12.0–46.0)
Lymphs Abs: 3.1 10*3/uL (ref 0.7–4.0)
MCHC: 33.1 g/dL (ref 30.0–36.0)
MCV: 87.1 fl (ref 78.0–100.0)
MONO ABS: 0.6 10*3/uL (ref 0.1–1.0)
Monocytes Relative: 8.4 % (ref 3.0–12.0)
NEUTROS ABS: 3.1 10*3/uL (ref 1.4–7.7)
NEUTROS PCT: 44.2 % (ref 43.0–77.0)
PLATELETS: 286 10*3/uL (ref 150.0–400.0)
RBC: 5.36 Mil/uL (ref 4.22–5.81)
RDW: 13 % (ref 11.5–15.5)
WBC: 7 10*3/uL (ref 4.0–10.5)

## 2017-01-29 LAB — TSH: TSH: 2.35 u[IU]/mL (ref 0.35–4.50)

## 2017-01-29 NOTE — Patient Instructions (Signed)
We are setting up colonoscopy You have been placed on waiting list for shingles vaccine.

## 2017-01-29 NOTE — Progress Notes (Addendum)
Subjective:     Patient ID: TOA MIA, male   DOB: 30-Aug-1965, 52 y.o.   MRN: [id number]  HPI  Patient seen for physical exam. He has mild hyperlipidemia but takes no medications. Generally doing well. Still runs about 3 days per week. Just turned 51 this year. No history of prior colonoscopy screening. He declines flu vaccination. Tetanus up-to-date. Does have some interest in shingles vaccine.  Family history reviewed. No history of diabetes or premature heart disease. No history of cancer. Patient is nonsmoker. No regular alcohol use.  The 10-year ASCVD risk score Denman George DC Montez Hageman., et al., 2013) is: 4.7%   Values used to calculate the score:     Age: 66 years     Sex: Male     Is Non-Hispanic African American: No     Diabetic: No     Tobacco smoker: No     Systolic Blood Pressure: 120 mmHg     Is BP treated: No     HDL Cholesterol: 59.1 mg/dL     Total Cholesterol: 306 mg/dL   Past Medical History:  Diagnosis Date  . HYPERLIPIDEMIA 09/12/2006   Qualifier: Diagnosis of  By: Everett Graff    . Prostatitis, acute 06/18/2011   No past surgical history on file.  reports that he has quit smoking. he has never used smokeless tobacco. He reports that he drinks alcohol. He reports that he does not use drugs. family history includes Diabetes in his maternal grandfather; Hyperlipidemia in his other. Not on File  Review of Systems  Constitutional: Negative for activity change, appetite change, fatigue and fever.  HENT: Negative for congestion, ear pain and trouble swallowing.   Eyes: Negative for pain and visual disturbance.  Respiratory: Negative for cough, shortness of breath and wheezing.   Cardiovascular: Negative for chest pain and palpitations.  Gastrointestinal: Negative for abdominal distention, abdominal pain, blood in stool, constipation, diarrhea, nausea, rectal pain and vomiting.  Genitourinary: Negative for dysuria, hematuria and testicular pain.  Musculoskeletal:  Negative for arthralgias and joint swelling.  Skin: Negative for rash.  Neurological: Negative for dizziness, syncope and headaches.  Hematological: Negative for adenopathy.  Psychiatric/Behavioral: Negative for confusion and dysphoric mood.       Objective:   Physical Exam  Constitutional: He is oriented to person, place, and time. He appears well-developed and well-nourished. No distress.  HENT:  Head: Normocephalic and atraumatic.  Right Ear: External ear normal.  Left Ear: External ear normal.  Mouth/Throat: Oropharynx is clear and moist.  Eyes: Conjunctivae and EOM are normal. Pupils are equal, round, and reactive to light.  Neck: Normal range of motion. Neck supple. No thyromegaly present.  Cardiovascular: Normal rate, regular rhythm and normal heart sounds.  No murmur heard. Pulmonary/Chest: No respiratory distress. He has no wheezes. He has no rales.  Abdominal: Soft. Bowel sounds are normal. He exhibits no distension and no mass. There is no tenderness. There is no rebound and no guarding.  Genitourinary: Rectum normal and prostate normal.  Musculoskeletal: He exhibits no edema.  Lymphadenopathy:    He has no cervical adenopathy.  Neurological: He is alert and oriented to person, place, and time. He displays normal reflexes. No cranial nerve deficit.  Skin: No rash noted.  Psychiatric: He has a normal mood and affect.       Assessment:     Physical exam. Generally healthy 52 year old male. The following issues were discussed    Plan:     -Flu vaccine offered and  declined -Set up screening colonoscopy -Patient requesting new shingles vaccine and he will be placed on waiting list for that -Obtain screening lab work -The natural history of prostate cancer and ongoing controversy regarding screening and potential treatment outcomes of prostate cancer has been discussed with the patient. The meaning of a false positive PSA and a false negative PSA has been discussed. He  indicates understanding of the limitations of this screening test and wishes to proceed with screening PSA testing.  Kristian CoveyBruce W Jalaine Riggenbach MD [facility]

## 2017-02-05 ENCOUNTER — Encounter: Payer: Self-pay | Admitting: Gastroenterology

## 2017-02-08 ENCOUNTER — Ambulatory Visit: Payer: 59

## 2017-02-11 ENCOUNTER — Ambulatory Visit (INDEPENDENT_AMBULATORY_CARE_PROVIDER_SITE_OTHER): Payer: 59 | Admitting: Family Medicine

## 2017-02-11 DIAGNOSIS — Z23 Encounter for immunization: Secondary | ICD-10-CM | POA: Diagnosis not present

## 2017-03-08 ENCOUNTER — Ambulatory Visit (AMBULATORY_SURGERY_CENTER): Payer: Self-pay

## 2017-03-08 VITALS — Ht 60.0 in | Wt 145.6 lb

## 2017-03-08 DIAGNOSIS — Z1211 Encounter for screening for malignant neoplasm of colon: Secondary | ICD-10-CM

## 2017-03-08 MED ORDER — NA SULFATE-K SULFATE-MG SULF 17.5-3.13-1.6 GM/177ML PO SOLN: 1.0000 | Freq: Once | ORAL | 0 refills | Status: AC

## 2017-03-08 NOTE — Progress Notes (Signed)
Per pt, no allergies to soy or egg products.Pt not taking any weight loss meds or using  O2 at home.  Pt refused emmi video. 

## 2017-03-11 ENCOUNTER — Encounter: Payer: Self-pay | Admitting: Gastroenterology

## 2017-03-22 ENCOUNTER — Encounter: Payer: Self-pay | Admitting: Gastroenterology

## 2017-03-22 ENCOUNTER — Other Ambulatory Visit: Payer: Self-pay

## 2017-03-22 ENCOUNTER — Ambulatory Visit (AMBULATORY_SURGERY_CENTER): Payer: 59 | Admitting: Gastroenterology

## 2017-03-22 VITALS — BP 115/74 | HR 69 | Temp 98.0°F | Resp 11 | Ht 60.0 in | Wt 145.0 lb

## 2017-03-22 DIAGNOSIS — Z1211 Encounter for screening for malignant neoplasm of colon: Secondary | ICD-10-CM

## 2017-03-22 MED ORDER — SODIUM CHLORIDE 0.9 % IV SOLN: 500.0000 mL | INTRAVENOUS | Status: AC

## 2017-03-22 MED ORDER — SODIUM CHLORIDE 0.9 % IV SOLN: 500.0000 mL | Freq: Once | INTRAVENOUS | Status: DC

## 2017-03-22 NOTE — Progress Notes (Signed)
I have reviewed the patient's medical history in detail and updated the computerized patient record.

## 2017-03-22 NOTE — Op Note (Signed)
[facility] Patient Name: Curtis Franklin Procedure Date: 03/22/2017 10:49 AM MRN: [id number] Endoscopist: Meryl DareMalcolm T Ronae Noell , MD Age: 52 Referring MD:  Date of Birth: Aug 02, 1965 Gender: Male Account #: [id number] Procedure:                Colonoscopy Indications:              Screening for colorectal malignant neoplasm Medicines:                Monitored Anesthesia Care Procedure:                Pre-Anesthesia Assessment:                           - Prior to the procedure, a History and Physical                            was performed, and patient medications and                            allergies were reviewed. The patient's tolerance of                            previous anesthesia was also reviewed. The risks                            and benefits of the procedure and the sedation                            options and risks were discussed with the patient.                            All questions were answered, and informed consent                            was obtained. Prior Anticoagulants: The patient has                            taken no previous anticoagulant or antiplatelet                            agents. ASA Grade Assessment: II - A patient with                            mild systemic disease. After reviewing the risks                            and benefits, the patient was deemed in                            satisfactory condition to undergo the procedure.                           After obtaining informed consent, the colonoscope  was passed under direct vision. Throughout the                            procedure, the patient's blood pressure, pulse, and                            oxygen saturations were monitored continuously. The                            Model PCF-[id number]) scope was introduced                            through the anus and advanced to the the cecum,                            identified by  appendiceal orifice and ileocecal                            valve. The ileocecal valve, appendiceal orifice,                            and rectum were photographed. The quality of the                            bowel preparation was good. The colonoscopy was                            performed without difficulty. The patient tolerated                            the procedure well. Scope In: 10:58:06 AM Scope Out: 11:08:24 AM Scope Withdrawal Time: 0 hours 8 minutes 36 seconds  Total Procedure Duration: 0 hours 10 minutes 18 seconds  Findings:                 The perianal and digital rectal examinations were                            normal.                           Multiple medium-mouthed diverticula were found in                            the ascending colon and cecum. There was no                            evidence of diverticular bleeding.                           The exam was otherwise without abnormality on                            direct and retroflexion views. Complications:            No immediate complications. Estimated  blood loss:                            None. Estimated Blood Loss:     Estimated blood loss: none. Impression:               - Mild diverticulosis in the ascending colon and in                            the cecum. There was no evidence of diverticular                            bleeding.                           - The examination was otherwise normal on direct                            and retroflexion views.                           - No specimens collected. Recommendation:           - Repeat colonoscopy in 10 years for screening                            purposes.                           - Patient has a contact number available for                            emergencies. The signs and symptoms of potential                            delayed complications were discussed with the                            patient. Return to normal activities  tomorrow.                            Written discharge instructions were provided to the                            patient.                           - High fiber diet.                           - Continue present medications. Meryl Dare, MD 03/22/2017 11:14:21 AM This report has been signed electronically.

## 2017-03-22 NOTE — Patient Instructions (Signed)
YOU HAD AN ENDOSCOPIC PROCEDURE TODAY AT THE [facility]:   Refer to the procedure report that was given to you for any specific questions about what was found during the examination.  If the procedure report does not answer your questions, please call your gastroenterologist to clarify.  If you requested that your care partner not be given the details of your procedure findings, then the procedure report has been included in a sealed envelope for you to review at your convenience later.  YOU SHOULD EXPECT: Some feelings of bloating in the abdomen. Passage of more gas than usual.  Walking can help get rid of the air that was put into your GI tract during the procedure and reduce the bloating. If you had a lower endoscopy (such as a colonoscopy or flexible sigmoidoscopy) you may notice spotting of blood in your stool or on the toilet paper. If you underwent a bowel prep for your procedure, you may not have a normal bowel movement for a few days.  Please Note:  You might notice some irritation and congestion in your nose or some drainage.  This is from the oxygen used during your procedure.  There is no need for concern and it should clear up in a day or so.  SYMPTOMS TO REPORT IMMEDIATELY:   Following lower endoscopy (colonoscopy or flexible sigmoidoscopy):  Excessive amounts of blood in the stool  Significant tenderness or worsening of abdominal pains  Swelling of the abdomen that is new, acute  Fever of 100F or higher   For urgent or emergent issues, a gastroenterologist can be reached at any hour by calling [phone].   DIET:  We do recommend a small meal at first, but then you may proceed to your regular diet.  Drink plenty of fluids but you should avoid alcoholic beverages for 24 hours.  Try to increase the fiber in your diet, and drink plenty of water.  ACTIVITY:  You should plan to take it easy for the rest of today and you should NOT DRIVE or use heavy machinery until  tomorrow (because of the sedation medicines used during the test).    FOLLOW UP: Our staff will call the number listed on your records the next business day following your procedure to check on you and address any questions or concerns that you may have regarding the information given to you following your procedure. If we do not reach you, we will leave a message.  However, if you are feeling well and you are not experiencing any problems, there is no need to return our call.  We will assume that you have returned to your regular daily activities without incident.  If any biopsies were taken you will be contacted by phone or by letter within the next 1-3 weeks.  Please call us at [phone] if you have not heard about the biopsies in 3 weeks.    SIGNATURES/CONFIDENTIALITY: You and/or your care partner have signed paperwork which will be entered into your electronic medical record.  These signatures attest to the fact that that the information above on your After Visit Summary has been reviewed and is understood.  Full responsibility of the confidentiality of this discharge information lies with you and/or your care-partner. 

## 2017-03-22 NOTE — Progress Notes (Signed)
Report to PACU, RN, vss, BBS= Clear.  

## 2017-03-25 ENCOUNTER — Telehealth: Payer: Self-pay

## 2017-03-25 NOTE — Telephone Encounter (Signed)
  Follow up Call-  Call back number 03/22/2017  Post procedure Call Back phone  # [phone]  Permission to leave phone message Yes  Some recent data might be hidden     Patient questions:  Do you have a fever, pain , or abdominal swelling? No. Pain Score  0 *  Have you tolerated food without any problems? Yes.    Have you been able to return to your normal activities? Yes.    Do you have any questions about your discharge instructions: Diet   No. Medications  No. Follow up visit  No.  Do you have questions or concerns about your Care? No.  Actions: * If pain score is 4 or above: No action needed, pain <4.

## 2017-04-05 ENCOUNTER — Ambulatory Visit (INDEPENDENT_AMBULATORY_CARE_PROVIDER_SITE_OTHER): Payer: 59

## 2017-04-05 DIAGNOSIS — Z23 Encounter for immunization: Secondary | ICD-10-CM

## 2017-04-05 NOTE — Progress Notes (Signed)
Per orders of Dr. Caryl NeverBurchette, injection of shingrix given by Carola RhineNancy N Qusay Villada. Patient tolerated injection well.

## 2019-11-16 ENCOUNTER — Ambulatory Visit (INDEPENDENT_AMBULATORY_CARE_PROVIDER_SITE_OTHER): Payer: No Typology Code available for payment source | Admitting: Family Medicine

## 2019-11-16 ENCOUNTER — Encounter: Payer: Self-pay | Admitting: Family Medicine

## 2019-11-16 ENCOUNTER — Other Ambulatory Visit: Payer: Self-pay

## 2019-11-16 ENCOUNTER — Ambulatory Visit (INDEPENDENT_AMBULATORY_CARE_PROVIDER_SITE_OTHER): Payer: No Typology Code available for payment source

## 2019-11-16 VITALS — BP 122/72 | HR 79 | Temp 99.0°F | Wt 155.0 lb

## 2019-11-16 DIAGNOSIS — R600 Localized edema: Secondary | ICD-10-CM

## 2019-11-16 DIAGNOSIS — M79644 Pain in right finger(s): Secondary | ICD-10-CM

## 2019-11-16 NOTE — Patient Instructions (Signed)
Hand Pain Many things can cause hand pain. Some common causes are:  An injury.  Repeating the same movement with your hand over and over (overuse).  Osteoporosis.  Arthritis.  Lumps in the tendons or joints of the hand and wrist (ganglion cysts).  Nerve compression syndromes (carpal tunnel syndrome).  Inflammation of the tendons (tendinitis).  Infection. Follow these instructions at home: Pay attention to any changes in your symptoms. Take these actions to help with your discomfort: Managing pain, stiffness, and swelling   Take over-the-counter and prescription medicines only as told by your health care provider.  Wear a hand splint or support as told by your health care provider.  If directed, put ice on the affected area: ? Put ice in a plastic bag. ? Place a towel between your skin and the bag. ? Leave the ice on for 20 minutes, 2-3 times a day. Activity  Take breaks from repetitive activity often.  Avoid activities that make your pain worse.  Minimize stress on your hands and wrists as much as possible.  Do stretches or exercises as told by your health care provider.  Do not do activities that make your pain worse. Contact a health care provider if:  Your pain does not get better after a few days of self-care.  Your pain gets worse.  Your pain affects your ability to do your daily activities. Get help right away if:  Your hand becomes warm, red, or swollen.  Your hand is numb or tingling.  Your hand is extremely swollen or deformed.  Your hand or fingers turn white or blue.  You cannot move your hand, wrist, or fingers. Summary  Many things can cause hand pain.  Contact your health care provider if your pain does not get better after a few days of self care.  Minimize stress on your hands and wrists as much as possible.  Do not do activities that make your pain worse. This information is not intended to replace advice given to you by your  health care provider. Make sure you discuss any questions you have with your health care provider. Document Revised: 09/27/2017 Document Reviewed: 09/27/2017 Elsevier Patient Education  2020 [employer]  

## 2019-11-16 NOTE — Progress Notes (Signed)
Subjective:    Patient ID: Curtis Franklin, male    DOB: [birth year], 54 y.o.   MRN: [id number]  No chief complaint on file.   HPI Patient was seen today for acute concern.  Patient endorses right thumb pain after a fall x1 day.  Pt states he tripped over a step last night and fell with his R hand out.  Pt with edema at base of R thumb and pain in R thumb.  Pt took advil last night for his symptoms.  Pt is right handed.    Past Medical History:  Diagnosis Date  . HYPERLIPIDEMIA 09/12/2006   Qualifier: Diagnosis of  By: Everett Graff    . Prostatitis, acute 06/18/2011    Not on File  ROS General: Denies fever, chills, night sweats, changes in weight, changes in appetite HEENT: Denies headaches, ear pain, changes in vision, rhinorrhea, sore throat CV: Denies CP, palpitations, SOB, orthopnea Pulm: Denies SOB, cough, wheezing GI: Denies abdominal pain, nausea, vomiting, diarrhea, constipation GU: Denies dysuria, hematuria, frequency, vaginal discharge Msk: denies muscle cramps, joint pains +R hand pain and edema Neuro: Denies weakness, numbness, tingling Skin: Denies rashes, bruising Psych: Denies depression, anxiety, hallucinations      Objective:    Blood pressure 122/72, pulse 79, temperature 99 F (37.2 C), temperature source Oral, weight 155 lb (70.3 kg), SpO2 99 %.  Gen. Pleasant, well-nourished, in no distress, normal affect  HEENT: [place]/AT, face symmetric, conjunctiva clear, no scleral icterus, PERRLA, EOMI, nares patent without drainage. Lungs: no accessory muscle use Cardiovascular: RRR, no peripheral edema Musculoskeletal:  R hand with edema and ecchymosis of thenar eminence.  No TTP of R wrist or R digits 2-5.  Limited ROM of R thumb 2/2 edema. No deformities, no cyanosis or clubbing, normal tone Neuro:  A&Ox3, CN II-XII intact, normal gait Skin:  Warm, dry, intact.  R hand with edema, ecchymosis of thenar eminence.        Wt Readings from Last 3 Encounters:    11/16/19 155 lb (70.3 kg)  03/22/17 145 lb (65.8 kg)  03/08/17 145 lb 9.6 oz (66 kg)    Lab Results  Component Value Date   WBC 7.0 01/29/2017   HGB 15.4 01/29/2017   HCT 46.6 01/29/2017   PLT 286.0 01/29/2017   GLUCOSE 102 (H) 01/29/2017   CHOL 306 (H) 01/29/2017   TRIG 190.0 (H) 01/29/2017   HDL 59.10 01/29/2017   LDLDIRECT 206.7 12/15/2013   LDLCALC 209 (H) 01/29/2017   ALT 37 01/29/2017   AST 28 01/29/2017   NA 137 01/29/2017   K 4.3 01/29/2017   CL 103 01/29/2017   CREATININE 1.07 01/29/2017   BUN 16 01/29/2017   CO2 24 01/29/2017   TSH 2.35 01/29/2017   PSA 3.47 01/29/2017    Assessment/Plan:  Thumb pain, right  - Plan: DG Hand Complete Right, Ambulatory referral to Hand Surgery  Edema of hand  -s/p fall -concern for fx -will obtain xray -continue supportive care including ice, elevation, rest, heat -Patient given Tylenol in clinic -Hand wrapped with Ace bandage - Plan: DG Hand Complete Right, Ambulatory referral to Hand Surgery  F/u prn  Abbe Amsterdam, MD

## 2019-11-17 ENCOUNTER — Encounter: Payer: Self-pay | Admitting: Family Medicine

## 2020-10-04 ENCOUNTER — Ambulatory Visit (INDEPENDENT_AMBULATORY_CARE_PROVIDER_SITE_OTHER): Payer: No Typology Code available for payment source | Admitting: Family Medicine

## 2020-10-04 ENCOUNTER — Other Ambulatory Visit: Payer: Self-pay

## 2020-10-04 VITALS — BP 118/80 | HR 74 | Temp 98.0°F | Wt 154.8 lb

## 2020-10-04 DIAGNOSIS — R350 Frequency of micturition: Secondary | ICD-10-CM

## 2020-10-04 DIAGNOSIS — R39198 Other difficulties with micturition: Secondary | ICD-10-CM

## 2020-10-04 MED ORDER — TAMSULOSIN HCL 0.4 MG PO CAPS: 0.4000 mg | ORAL_CAPSULE | Freq: Every day | ORAL | 3 refills | Status: DC

## 2020-10-04 NOTE — Progress Notes (Signed)
Established Patient Office Visit  Subjective:  Patient ID: Curtis Franklin, male    DOB: 11/23/1965  Age: 55 y.o. MRN: [id number]  CC:  Chief Complaint  Patient presents with   Urinary Frequency    Been about a year, frequency at night    HPI Curtis Franklin presents for slow urinary stream progressive over several weeks.  For years he has gotten up once or twice at night but more recently is gotten up occasionally as often as every hour.  Denies any thirst or weight loss.  He has slow urinary stream.  Feels he may be having difficulty emptying.  Does not take any anticholinergic medications.  Does have past history of elevated PSA but these have been trending down.  Suspected BPH.  No history of known urethral stricture.  Denies any recent urinary frequency.  Tries avoid caffeine in general.  Does drink wine most nights but recently has tried to scale back.  Appetite and weight have been stable.  Past Medical History:  Diagnosis Date   HYPERLIPIDEMIA 09/12/2006   Qualifier: Diagnosis of  By: Tawanna Cooler RN, Ellen     Prostatitis, acute 06/18/2011    Past Surgical History:  Procedure Laterality Date   NO PAST SURGERIES      Family History  Problem Relation Age of Onset   Hyperlipidemia Other    Diabetes Maternal Grandfather    Hypertension Mother     Social History   Socioeconomic History   Marital status: Married    Spouse name: Not on file   Number of children: Not on file   Years of education: Not on file   Highest education level: Not on file  Occupational History   Not on file  Tobacco Use   Smoking status: Former    Types: Cigarettes    Quit date: 03/09/2003    Years since quitting: 17.5   Smokeless tobacco: Never  Substance and Sexual Activity   Alcohol use: Yes    Alcohol/week: 2.0 standard drinks    Types: 2 Glasses of wine per week   Drug use: No   Sexual activity: Not on file  Other Topics Concern   Not on file  Social History Narrative   Not on  file   Social Determinants of Health   Financial Resource Strain: Not on file  Food Insecurity: Not on file  Transportation Needs: Not on file  Physical Activity: Not on file  Stress: Not on file  Social Connections: Not on file  Intimate Partner Violence: Not on file    No outpatient medications prior to visit.   Facility-Administered Medications Prior to Visit  Medication Dose Route Frequency Provider Last Rate Last Admin   0.9 %  sodium chloride infusion  500 mL Intravenous Continuous Meryl Dare, MD        Not on File  ROS Review of Systems  Constitutional:  Negative for chills and fever.  Genitourinary:  Positive for difficulty urinating. Negative for hematuria, testicular pain and urgency.     Objective:    Physical Exam Vitals reviewed.  Constitutional:      Appearance: Normal appearance.  Cardiovascular:     Rate and Rhythm: Normal rate and regular rhythm.  Pulmonary:     Effort: Pulmonary effort is normal.     Breath sounds: Normal breath sounds.  Genitourinary:    Comments: Digital exam reveals mildly enlarged prostate.  Nonboggy.  Nontender.  No nodules palpated. Neurological:     Mental Status:  He is alert.    BP 118/80 (BP Location: Right Arm, Patient Position: Sitting, Cuff Size: Normal)   Pulse 74   Temp 98 F (36.7 C) (Oral)   Wt 154 lb 12.8 oz (70.2 kg)   SpO2 98%   BMI 30.23 kg/m  Wt Readings from Last 3 Encounters:  10/04/20 154 lb 12.8 oz (70.2 kg)  11/16/19 155 lb (70.3 kg)  03/22/17 145 lb (65.8 kg)     Health Maintenance Due  Topic Date Due   HIV Screening  Never done   Hepatitis C Screening  Never done   COVID-19 Vaccine (2 - Booster for Janssen series) 05/23/2019   INFLUENZA VACCINE  Never done    There are no preventive care reminders to display for this patient.  Lab Results  Component Value Date   TSH 2.35 01/29/2017   Lab Results  Component Value Date   WBC 7.0 01/29/2017   HGB 15.4 01/29/2017   HCT 46.6  01/29/2017   MCV 87.1 01/29/2017   PLT 286.0 01/29/2017   Lab Results  Component Value Date   NA 137 01/29/2017   K 4.3 01/29/2017   CO2 24 01/29/2017   GLUCOSE 102 (H) 01/29/2017   BUN 16 01/29/2017   CREATININE 1.07 01/29/2017   BILITOT 1.0 01/29/2017   ALKPHOS 60 01/29/2017   AST 28 01/29/2017   ALT 37 01/29/2017   PROT 7.2 01/29/2017   ALBUMIN 4.1 01/29/2017   CALCIUM 9.3 01/29/2017   GFR 77.58 01/29/2017   Lab Results  Component Value Date   CHOL 306 (H) 01/29/2017   Lab Results  Component Value Date   HDL 59.10 01/29/2017   Lab Results  Component Value Date   LDLCALC 209 (H) 01/29/2017   Lab Results  Component Value Date   TRIG 190.0 (H) 01/29/2017   Lab Results  Component Value Date   CHOLHDL 5 01/29/2017   No results found for: HGBA1C    Assessment & Plan:   Slow urinary stream.  Suspect symptomatic BPH.  Patient does not describe any urine frequency or thirst or weight loss.  No history of diabetes.  Not describing any infectious symptoms.  -Start Flomax 0.4 mg nightly -Continue to avoid excessive fluids at night. -He has scheduled physical and a few weeks in middle of October.  We will plan to get labs at that time including PSA and blood chemistries -Avoid anticholinergic medications including Benadryl   Meds ordered this encounter  Medications   tamsulosin (FLOMAX) 0.4 MG CAPS capsule    Sig: Take 1 capsule (0.4 mg total) by mouth daily.    Dispense:  30 capsule    Refill:  3    Follow-up: No follow-ups on file.    Evelena Peat, MD

## 2020-11-02 ENCOUNTER — Encounter: Payer: Self-pay | Admitting: Family Medicine

## 2020-11-02 ENCOUNTER — Other Ambulatory Visit: Payer: Self-pay

## 2020-11-02 ENCOUNTER — Ambulatory Visit (INDEPENDENT_AMBULATORY_CARE_PROVIDER_SITE_OTHER): Payer: No Typology Code available for payment source | Admitting: Family Medicine

## 2020-11-02 VITALS — BP 124/80 | HR 80 | Temp 98.4°F | Ht 60.5 in | Wt 153.7 lb

## 2020-11-02 DIAGNOSIS — R39198 Other difficulties with micturition: Secondary | ICD-10-CM | POA: Diagnosis not present

## 2020-11-02 DIAGNOSIS — Z125 Encounter for screening for malignant neoplasm of prostate: Secondary | ICD-10-CM | POA: Diagnosis not present

## 2020-11-02 DIAGNOSIS — Z0000 Encounter for general adult medical examination without abnormal findings: Secondary | ICD-10-CM | POA: Diagnosis not present

## 2020-11-02 LAB — BASIC METABOLIC PANEL WITH GFR
BUN: 10 mg/dL (ref 6–23)
CO2: 21 meq/L (ref 19–32)
Calcium: 9.1 mg/dL (ref 8.4–10.5)
Chloride: 104 meq/L (ref 96–112)
Creatinine, Ser: 1.03 mg/dL (ref 0.40–1.50)
GFR: 82.47 mL/min
Glucose, Bld: 98 mg/dL (ref 70–99)
Potassium: 4.6 meq/L (ref 3.5–5.1)
Sodium: 136 meq/L (ref 135–145)

## 2020-11-02 LAB — TSH: TSH: 3.01 u[IU]/mL (ref 0.35–5.50)

## 2020-11-02 LAB — LIPID PANEL
Cholesterol: 311 mg/dL — ABNORMAL HIGH (ref 0–200)
HDL: 63 mg/dL (ref >39.00)
NonHDL: 248.42
Total CHOL/HDL Ratio: 5
Triglycerides: 207 mg/dL — ABNORMAL HIGH (ref 0.0–149.0)
VLDL: 41.4 mg/dL — ABNORMAL HIGH (ref 0.0–40.0)

## 2020-11-02 LAB — CBC WITH DIFFERENTIAL/PLATELET
Basophils Absolute: 0 10*3/uL (ref 0.0–0.1)
Basophils Relative: 0.7 % (ref 0.0–3.0)
Eosinophils Absolute: 0.1 10*3/uL (ref 0.0–0.7)
Eosinophils Relative: 1.2 % (ref 0.0–5.0)
HCT: 45.2 % (ref 39.0–52.0)
Hemoglobin: 14.9 g/dL (ref 13.0–17.0)
Lymphocytes Relative: 34.6 % (ref 12.0–46.0)
Lymphs Abs: 2.6 10*3/uL (ref 0.7–4.0)
MCHC: 33 g/dL (ref 30.0–36.0)
MCV: 87.6 fl (ref 78.0–100.0)
Monocytes Absolute: 0.6 10*3/uL (ref 0.1–1.0)
Monocytes Relative: 8 % (ref 3.0–12.0)
Neutro Abs: 4.1 10*3/uL (ref 1.4–7.7)
Neutrophils Relative %: 55.5 % (ref 43.0–77.0)
Platelets: 262 10*3/uL (ref 150.0–400.0)
RBC: 5.16 Mil/uL (ref 4.22–5.81)
RDW: 13.7 % (ref 11.5–15.5)
WBC: 7.5 10*3/uL (ref 4.0–10.5)

## 2020-11-02 LAB — HEPATIC FUNCTION PANEL
ALT: 33 U/L (ref 0–53)
AST: 28 U/L (ref 0–37)
Albumin: 4.2 g/dL (ref 3.5–5.2)
Alkaline Phosphatase: 68 U/L (ref 39–117)
Bilirubin, Direct: 0.1 mg/dL (ref 0.0–0.3)
Total Bilirubin: 0.7 mg/dL (ref 0.2–1.2)
Total Protein: 7.2 g/dL (ref 6.0–8.3)

## 2020-11-02 LAB — PSA: PSA: 3.07 ng/mL (ref 0.10–4.00)

## 2020-11-02 LAB — LDL CHOLESTEROL, DIRECT: Direct LDL: 210 mg/dL

## 2020-11-02 NOTE — Addendum Note (Signed)
Addended by: Kandra Nicolas on: 11/02/2020 10:51 AM   Modules accepted: Orders

## 2020-11-02 NOTE — Progress Notes (Signed)
Established Patient Office Visit  Subjective:  Patient ID: Curtis Franklin, male    DOB: 03-23-1965  Age: 55 y.o. MRN: [id number]  CC:  Chief Complaint  Patient presents with   Annual Exam    HPI Curtis Franklin presents for physical exam.  He was seen recently with slow urinary stream.  Sometimes getting up as much as 5-6 times at night.  Very slow stream.  No burning.  Has had prior elevated PSA back to 2013 but levels subsequently came down.  Recently tried on Flomax but did not see any improvement.  No recent burning with urination.  Generally healthy.  Takes no regular medications.  Health maintenance reviewed  -Shingles vaccine completed -Flu vaccine already given -Colonoscopy due 2029 -Tetanus due 2025 -No history of hepatitis C screening but low risk  Social history-he is married.  He has 2 daughters ages 71 and 70.  Both are trying to get into medical school currently.  He quit smoking 2005.  10-year history.  Drinks a glass of wine about 3 days/week.  Family history-Father died of dementia complications around age 72.  Mother is alive and has history of hypertension.  He has a sister with no active medical problems.  Maternal grandfather with diabetes.  Past Medical History:  Diagnosis Date   HYPERLIPIDEMIA 09/12/2006   Qualifier: Diagnosis of  By: Tawanna Cooler RN, Ellen     Prostatitis, acute 06/18/2011    Past Surgical History:  Procedure Laterality Date   NO PAST SURGERIES      Family History  Problem Relation Age of Onset   Hypertension Mother    Hypothyroidism Mother    Dementia Father    Diabetes Maternal Grandfather    Hyperlipidemia Other     Social History   Socioeconomic History   Marital status: Married    Spouse name: Not on file   Number of children: Not on file   Years of education: Not on file   Highest education level: Not on file  Occupational History   Not on file  Tobacco Use   Smoking status: Former    Types: Cigarettes    Quit  date: 03/09/2003    Years since quitting: 17.6   Smokeless tobacco: Never  Substance and Sexual Activity   Alcohol use: Yes    Alcohol/week: 2.0 standard drinks    Types: 2 Glasses of wine per week   Drug use: No   Sexual activity: Not on file  Other Topics Concern   Not on file  Social History Narrative   Not on file   Social Determinants of Health   Financial Resource Strain: Not on file  Food Insecurity: Not on file  Transportation Needs: Not on file  Physical Activity: Not on file  Stress: Not on file  Social Connections: Not on file  Intimate Partner Violence: Not on file    Outpatient Medications Prior to Visit  Medication Sig Dispense Refill   tamsulosin (FLOMAX) 0.4 MG CAPS capsule Take 1 capsule (0.4 mg total) by mouth daily. 30 capsule 3   Facility-Administered Medications Prior to Visit  Medication Dose Route Frequency Provider Last Rate Last Admin   0.9 %  sodium chloride infusion  500 mL Intravenous Continuous Meryl Dare, MD        No Known Allergies  ROS Review of Systems  Constitutional:  Negative for activity change, appetite change, fatigue and fever.  HENT:  Negative for congestion, ear pain and trouble swallowing.   Eyes:  Negative for pain and visual disturbance.  Respiratory:  Negative for cough, shortness of breath and wheezing.   Cardiovascular:  Negative for chest pain and palpitations.  Gastrointestinal:  Negative for abdominal distention, abdominal pain, blood in stool, constipation, diarrhea, nausea, rectal pain and vomiting.  Genitourinary:  Positive for difficulty urinating and frequency. Negative for hematuria and testicular pain.  Musculoskeletal:  Negative for arthralgias and joint swelling.  Skin:  Negative for rash.  Neurological:  Negative for dizziness, syncope and headaches.  Hematological:  Negative for adenopathy.  Psychiatric/Behavioral:  Negative for confusion and dysphoric mood.      Objective:    Physical  Exam Constitutional:      General: He is not in acute distress.    Appearance: He is well-developed.  HENT:     Head: Normocephalic and atraumatic.     Right Ear: External ear normal.     Left Ear: External ear normal.  Eyes:     Conjunctiva/sclera: Conjunctivae normal.     Pupils: Pupils are equal, round, and reactive to light.  Neck:     Thyroid: No thyromegaly.  Cardiovascular:     Rate and Rhythm: Normal rate and regular rhythm.     Heart sounds: Normal heart sounds. No murmur heard. Pulmonary:     Effort: No respiratory distress.     Breath sounds: No wheezing or rales.  Abdominal:     General: Bowel sounds are normal. There is no distension.     Palpations: Abdomen is soft. There is no mass.     Tenderness: There is no abdominal tenderness. There is no guarding or rebound.  Musculoskeletal:     Cervical back: Normal range of motion and neck supple.     Right lower leg: No edema.     Left lower leg: No edema.  Lymphadenopathy:     Cervical: No cervical adenopathy.  Skin:    Findings: No rash.  Neurological:     Mental Status: He is alert and oriented to person, place, and time.     Cranial Nerves: No cranial nerve deficit.    BP 124/80 (BP Location: Left Arm, Patient Position: Sitting, Cuff Size: Normal)   Pulse 80   Temp 98.4 F (36.9 C) (Oral)   Ht 5' 0.5" (1.537 m)   Wt 153 lb 11.2 oz (69.7 kg)   SpO2 98%   BMI 29.52 kg/m  Wt Readings from Last 3 Encounters:  11/02/20 153 lb 11.2 oz (69.7 kg)  10/04/20 154 lb 12.8 oz (70.2 kg)  11/16/19 155 lb (70.3 kg)     Health Maintenance Due  Topic Date Due   HIV Screening  Never done   Hepatitis C Screening  Never done   COVID-19 Vaccine (2 - Booster for Janssen series) 05/23/2019    There are no preventive care reminders to display for this patient.  Lab Results  Component Value Date   TSH 2.35 01/29/2017   Lab Results  Component Value Date   WBC 7.0 01/29/2017   HGB 15.4 01/29/2017   HCT 46.6  01/29/2017   MCV 87.1 01/29/2017   PLT 286.0 01/29/2017   Lab Results  Component Value Date   NA 137 01/29/2017   K 4.3 01/29/2017   CO2 24 01/29/2017   GLUCOSE 102 (H) 01/29/2017   BUN 16 01/29/2017   CREATININE 1.07 01/29/2017   BILITOT 1.0 01/29/2017   ALKPHOS 60 01/29/2017   AST 28 01/29/2017   ALT 37 01/29/2017   PROT 7.2 01/29/2017  ALBUMIN 4.1 01/29/2017   CALCIUM 9.3 01/29/2017   GFR 77.58 01/29/2017   Lab Results  Component Value Date   CHOL 306 (H) 01/29/2017   Lab Results  Component Value Date   HDL 59.10 01/29/2017   Lab Results  Component Value Date   LDLCALC 209 (H) 01/29/2017   Lab Results  Component Value Date   TRIG 190.0 (H) 01/29/2017   Lab Results  Component Value Date   CHOLHDL 5 01/29/2017   No results found for: HGBA1C    Assessment & Plan:   Problem List Items Addressed This Visit   None Visit Diagnoses     Physical exam    -  Primary   Relevant Orders   Basic metabolic panel   Lipid panel   CBC with Differential/Platelet   TSH   Hepatic function panel   PSA   Hep C Antibody     Patient relates several month history of slow urinary stream and frequent nocturia.  No improvement with Flomax.  Recent exam revealed no evidence for acute prostatitis.  -Set up urology referral -Check labs including repeat PSA -Continue regular exercise habits -Flu vaccine already given -Check hepatitis C antibody -Other health maintenance up-to-date  No orders of the defined types were placed in this encounter.   Follow-up: No follow-ups on file.    Evelena Peat, MD

## 2020-11-03 LAB — HEPATITIS C ANTIBODY
Hepatitis C Ab: NONREACTIVE
SIGNAL TO CUT-OFF: <0.02 (ref <1.00)

## 2020-11-07 ENCOUNTER — Encounter: Payer: Self-pay | Admitting: Family Medicine

## 2020-11-14 ENCOUNTER — Other Ambulatory Visit: Payer: Self-pay

## 2020-11-14 MED ORDER — ROSUVASTATIN CALCIUM 20 MG PO TABS: 20.0000 mg | ORAL_TABLET | Freq: Every day | ORAL | 3 refills | Status: DC

## 2020-11-23 ENCOUNTER — Telehealth: Payer: Self-pay | Admitting: Family Medicine

## 2020-11-23 NOTE — Telephone Encounter (Signed)
Patient called to speak with Dr.Burchette about a personal question he has. Patient refused appointment for now as he wants message sent back first, but is okay with scheduling virtual appointment if directed to by [facility]    Good callback number is [phone]   Please advise

## 2020-11-23 NOTE — Telephone Encounter (Signed)
Left a detailed message on verified voice mail asking that the patient schedule a virtual visit so that we can make sure Dr. Caryl Never has adequate time to address his questions and concerns.

## 2020-11-24 NOTE — Telephone Encounter (Signed)
Left message for patient to call back  

## 2020-11-25 NOTE — Telephone Encounter (Signed)
Unable to reach the patient. Message will be closed.  

## 2020-11-25 NOTE — Telephone Encounter (Signed)
ATC, unable to leave a voice mail.  

## 2021-04-04 ENCOUNTER — Encounter: Payer: Self-pay | Admitting: Family Medicine

## 2021-04-04 ENCOUNTER — Ambulatory Visit (INDEPENDENT_AMBULATORY_CARE_PROVIDER_SITE_OTHER): Payer: No Typology Code available for payment source | Admitting: Family Medicine

## 2021-04-04 VITALS — BP 122/78 | HR 64 | Temp 97.6°F | Ht 60.5 in | Wt 154.1 lb

## 2021-04-04 DIAGNOSIS — R0683 Snoring: Secondary | ICD-10-CM

## 2021-04-04 NOTE — Progress Notes (Signed)
? ?Established Patient Office Visit ? ?Subjective:  ?Patient ID: Curtis Franklin, male    DOB: March 18, 1965  Age: 56 y.o. MRN: [id number] ? ?CC:  ?Chief Complaint  ?Patient presents with  ? Referral  ? ? ?HPI ?Curtis Franklin is seen basically requesting referral for rule out obstructive sleep apnea.  We have seen him in the past for some nocturia and slow urine stream.  He was tried on Flomax but had very little if any improvement.  Just recently saw urologist.  They did not feel like he had any significant prostate enlargement.  He does still have some further work-up pending there for his slow stream and they did start him back on Flomax.  Urologist was concerned that his frequent nocturia (usually about 4 times per night) might be related to disordered sleep.  Patient's wife has noticed that he snores most nights and this has been progressive.  She has observed some episodes of him gasping at night.  Minimal daytime somnolence.  No significant fatigue issues.  No history of hypertension.  No recent peripheral edema. ? ?Past Medical History:  ?Diagnosis Date  ? HYPERLIPIDEMIA 09/12/2006  ? Qualifier: Diagnosis of  By: Everett Graff    ? Prostatitis, acute 06/18/2011  ? ? ?Past Surgical History:  ?Procedure Laterality Date  ? NO PAST SURGERIES    ? ? ?Family History  ?Problem Relation Age of Onset  ? Hypertension Mother   ? Hypothyroidism Mother   ? Dementia Father   ? Diabetes Maternal Grandfather   ? Hyperlipidemia Other   ? ? ?Social History  ? ?Socioeconomic History  ? Marital status: Married  ?  Spouse name: Not on file  ? Number of children: Not on file  ? Years of education: Not on file  ? Highest education level: Not on file  ?Occupational History  ? Not on file  ?Tobacco Use  ? Smoking status: Former  ?  Types: Cigarettes  ?  Quit date: 03/09/2003  ?  Years since quitting: 18.0  ? Smokeless tobacco: Never  ?Substance and Sexual Activity  ? Alcohol use: Yes  ?  Alcohol/week: 2.0 standard drinks  ?  Types:  2 Glasses of wine per week  ? Drug use: No  ? Sexual activity: Not on file  ?Other Topics Concern  ? Not on file  ?Social History Narrative  ? Not on file  ? ?Social Determinants of Health  ? ?Financial Resource Strain: Not on file  ?Food Insecurity: Not on file  ?Transportation Needs: Not on file  ?Physical Activity: Not on file  ?Stress: Not on file  ?Social Connections: Not on file  ?Intimate Partner Violence: Not on file  ? ? ?Outpatient Medications Prior to Visit  ?Medication Sig Dispense Refill  ? rosuvastatin (CRESTOR) 20 MG tablet Take 1 tablet (20 mg total) by mouth daily. 90 tablet 3  ? tamsulosin (FLOMAX) 0.4 MG CAPS capsule Take 1 capsule (0.4 mg total) by mouth daily. 30 capsule 3  ? ?Facility-Administered Medications Prior to Visit  ?Medication Dose Route Frequency Provider Last Rate Last Admin  ? 0.9 %  sodium chloride infusion  500 mL Intravenous Continuous Meryl Dare, MD      ? ? ?No Known Allergies ? ?ROS ?Review of Systems  ?Constitutional:  Negative for fatigue.  ?Eyes:  Negative for visual disturbance.  ?Respiratory:  Negative for cough, chest tightness and shortness of breath.   ?Cardiovascular:  Negative for chest pain, palpitations and leg swelling.  ?  Genitourinary:  Positive for difficulty urinating. Negative for hematuria and urgency.  ?Neurological:  Negative for dizziness, syncope, weakness, light-headedness and headaches.  ? ?  ?Objective:  ?  ?Physical Exam ?Vitals reviewed.  ?Constitutional:   ?   Appearance: Normal appearance.  ?HENT:  ?   Mouth/Throat:  ?   Pharynx: Oropharynx is clear. No oropharyngeal exudate.  ?Cardiovascular:  ?   Rate and Rhythm: Normal rate and regular rhythm.  ?Pulmonary:  ?   Effort: Pulmonary effort is normal.  ?   Breath sounds: Normal breath sounds.  ?Musculoskeletal:  ?   Cervical back: Neck supple.  ?Lymphadenopathy:  ?   Cervical: No cervical adenopathy.  ?Neurological:  ?   Mental Status: He is alert.  ? ? ?BP 122/78 (BP Location: Left Arm,  Patient Position: Sitting, Cuff Size: Normal)   Pulse 64   Temp 97.6 ?F (36.4 ?C) (Oral)   Ht 5' 0.5" (1.537 m)   Wt 154 lb 1.6 oz (69.9 kg)   SpO2 98%   BMI 29.60 kg/m?  ?Wt Readings from Last 3 Encounters:  ?04/04/21 154 lb 1.6 oz (69.9 kg)  ?11/02/20 153 lb 11.2 oz (69.7 kg)  ?10/04/20 154 lb 12.8 oz (70.2 kg)  ? ? ? ?Health Maintenance Due  ?Topic Date Due  ? HIV Screening  Never done  ? COVID-19 Vaccine (2 - Booster for Janssen series) 05/23/2019  ? ? ?There are no preventive care reminders to display for this patient. ? ?Lab Results  ?Component Value Date  ? TSH 3.01 11/02/2020  ? ?Lab Results  ?Component Value Date  ? WBC 7.5 11/02/2020  ? HGB 14.9 11/02/2020  ? HCT 45.2 11/02/2020  ? MCV 87.6 11/02/2020  ? PLT 262.0 11/02/2020  ? ?Lab Results  ?Component Value Date  ? NA 136 11/02/2020  ? K 4.6 11/02/2020  ? CO2 21 11/02/2020  ? GLUCOSE 98 11/02/2020  ? BUN 10 11/02/2020  ? CREATININE 1.03 11/02/2020  ? BILITOT 0.7 11/02/2020  ? ALKPHOS 68 11/02/2020  ? AST 28 11/02/2020  ? ALT 33 11/02/2020  ? PROT 7.2 11/02/2020  ? ALBUMIN 4.2 11/02/2020  ? CALCIUM 9.1 11/02/2020  ? GFR 82.47 11/02/2020  ? ?Lab Results  ?Component Value Date  ? CHOL 311 (H) 11/02/2020  ? ?Lab Results  ?Component Value Date  ? HDL 63.00 11/02/2020  ? ?Lab Results  ?Component Value Date  ? LDLCALC 209 (H) 01/29/2017  ? ?Lab Results  ?Component Value Date  ? TRIG 207.0 (H) 11/02/2020  ? ?Lab Results  ?Component Value Date  ? CHOLHDL 5 11/02/2020  ? ?No results found for: HGBA1C ? ?  ?Assessment & Plan:  ? ?Problem List Items Addressed This Visit   ?None ?Visit Diagnoses   ? ? Snoring    -  Primary  ? Relevant Orders  ? Ambulatory referral to Pulmonology  ? ?  ? ?Patient has history of frequent snoring and observed gasping for air at night.  We administered Epworth sleep scale and he scored 13.  Urologist has concerns that some of his nocturia is related to potential disrupted sleep from sleep apnea. ? ?-Set up referral to pulmonary for  further evaluation. ? ?No orders of the defined types were placed in this encounter. ? ? ?Follow-up: No follow-ups on file.  ? ? ?Evelena Peat, MD ?

## 2021-04-20 ENCOUNTER — Telehealth: Payer: Self-pay | Admitting: Family Medicine

## 2021-04-20 NOTE — Telephone Encounter (Signed)
Unable to locate CT results.  ?Called patient to obtain more information  at call back number list, memory full unable to lvm.       Please advise of CT results? ?

## 2021-04-20 NOTE — Telephone Encounter (Signed)
Pt call and stated he want a call back about his CT Scan result. ?

## 2021-04-24 NOTE — Telephone Encounter (Signed)
Called patient at [phone], unable to lm mailbox was full.  ?

## 2021-05-24 ENCOUNTER — Encounter: Payer: Self-pay | Admitting: Pulmonary Disease

## 2021-05-24 ENCOUNTER — Ambulatory Visit (INDEPENDENT_AMBULATORY_CARE_PROVIDER_SITE_OTHER): Payer: No Typology Code available for payment source | Admitting: Pulmonary Disease

## 2021-05-24 VITALS — BP 128/72 | HR 80 | Temp 99.1°F | Ht 60.0 in | Wt 153.2 lb

## 2021-05-24 DIAGNOSIS — R0683 Snoring: Secondary | ICD-10-CM | POA: Diagnosis not present

## 2021-05-24 NOTE — Patient Instructions (Signed)
Will arrange for home sleep study Will call to arrange for follow up after sleep study reviewed  

## 2021-05-24 NOTE — Progress Notes (Signed)
? ?[facility] ? ?Chief Complaint  ?Patient presents with  ? Consult  ?  Snoring   ? ? ?Past Surgical History:  ?He  has a past surgical history that includes No past surgeries. ? ?Past Medical History:  ?HLD ? ?Constitutional:  ?BP 128/72 (BP Location: Left Arm, Patient Position: Sitting, Cuff Size: Normal)   Pulse 80   Temp 99.1 ?F (37.3 ?C) (Oral)   Ht 5' (1.524 m)   Wt 153 lb 3.2 oz (69.5 kg)   SpO2 96%   BMI 29.92 kg/m?  ? ?Brief Summary:  ?Curtis Franklin is a 56 y.o. male with snoring. ?  ? ? ? ?Subjective:  ? ?He was seen recently by urology and PCP for nocturia.  Urologic evaluation was unremarkable.  There was concern this could be related to sleep issues.  He does snore, and this has been getting worse. ? ?His wife says he stops breathing at night.  He naps on weekends for about 45 minutes. ? ?He goes to sleep at 10 pm.  He falls asleep in 10 to 15 minutes.  He wakes up 4 to 5 times to use the bathroom.  He gets out of bed between 7 and 8 am.  He feels okay in the morning.  He denies morning headache.  He does not use anything to help him fall sleep.  He drinks 1 or 2 cups of coffee.  He used to drink more but cut down to see if this improves his nighttime bathroom use, but it didn't cause any improvement. ? ?He denies sleep walking, sleep talking, bruxism, or nightmares.  There is no history of restless legs.  He denies sleep hallucinations, sleep paralysis, or cataplexy. ? ?The Epworth score is 4 out of 24. ? ? ?Physical Exam:  ? ?Appearance - well kempt  ? ?ENMT - no sinus tenderness, no oral exudate, no LAN, Mallampati 2 airway, no stridor, low laying soft palate, elongated uvula ? ?Respiratory - equal breath sounds bilaterally, no wheezing or rales ? ?CV - s1s2 regular rate and rhythm, no murmurs ? ?Ext - no clubbing, no edema ? ?Skin - no rashes ? ?Psych - normal mood and affect ?  ?Sleep Tests:  ? ? ?Social History:  ?He  reports that he quit  smoking about 18 years ago. His smoking use included cigarettes. He has never used smokeless tobacco. He reports current alcohol use of about 2.0 standard drinks per week. He reports that he does not use drugs. ? ?Family History:  ?His family history includes Dementia in his father; Diabetes in his maternal grandfather; Hyperlipidemia in an other family member; Hypertension in his mother; Hypothyroidism in his mother. ?  ? ?Discussion:  ?Snoring, sleep disruption, apnea, nocturia, and daytime sleepiness.  I am concerned he could have obstructive sleep apnea. ? ?Assessment/Plan:  ? ?Snoring with excessive daytime sleepiness. ?- will need to arrange for a home sleep study ? ?Nocturia. ?- reviewed how sympathetic discharge related to apneic events can contribute to this ? ?Obesity. ?- discussed how weight can impact sleep and risk for sleep disordered breathing ?- discussed options to assist with weight loss: combination of diet modification, cardiovascular and strength training exercises ? ?Cardiovascular risk. ?- had an extensive discussion regarding the adverse health consequences related to untreated sleep disordered breathing ?- specifically discussed the risks for hypertension, coronary artery disease, cardiac dysrhythmias, cerebrovascular disease, and diabetes ?- lifestyle modification discussed ? ?Safe driving practices. ?- discussed how  sleep disruption can increase risk of accidents, particularly when driving ?- safe driving practices were discussed ? ?Therapies for obstructive sleep apnea. ?- if the sleep study shows significant sleep apnea, then various therapies for treatment were reviewed: CPAP, oral appliance, and surgical interventions ? ?Time Spent Involved in Patient Care on Day of Examination:  ?35 minutes ? ?Follow up:  ? ?Patient Instructions  ?Will arrange for home sleep study ?Will call to arrange for follow up after sleep study reviewed ? ? ?Medication List:  ? ?Allergies as of 05/24/2021   ?No  Known Allergies ?  ? ?  ?Medication List  ?  ? ?  ? Accurate as of May 24, 2021  9:44 AM. If you have any questions, ask your nurse or doctor.  ?  ?  ? ?  ? ?rosuvastatin 20 MG tablet ?Commonly known as: Crestor ?Take 1 tablet (20 mg total) by mouth daily. ?  ?tamsulosin 0.4 MG Caps capsule ?Commonly known as: FLOMAX ?Take 1 capsule (0.4 mg total) by mouth daily. ?  ? ?  ? ? ?Signature:  ?Coralyn Helling, MD ?[facility] ?Pager - [phone] ?05/24/2021, 9:44 AM ?  ? ? ? ? ? ? ? ? ?

## 2021-08-08 ENCOUNTER — Ambulatory Visit: Payer: No Typology Code available for payment source

## 2021-08-08 DIAGNOSIS — R0683 Snoring: Secondary | ICD-10-CM

## 2021-08-10 ENCOUNTER — Telehealth: Payer: Self-pay | Admitting: Family Medicine

## 2021-08-10 NOTE — Telephone Encounter (Signed)
Pt stated he wanted to see what were the next steps after seeing the urologist and doing the sleep studies. Pt stated he will be sending a MyChart msg to Dr. Concerning his questions.   Please advise.

## 2021-08-11 NOTE — Telephone Encounter (Signed)
Pt informed of the message and verbalized understanding  

## 2021-08-18 ENCOUNTER — Ambulatory Visit: Payer: No Typology Code available for payment source

## 2021-08-18 DIAGNOSIS — G4733 Obstructive sleep apnea (adult) (pediatric): Secondary | ICD-10-CM | POA: Diagnosis not present

## 2021-08-25 ENCOUNTER — Telehealth: Payer: Self-pay | Admitting: Pulmonary Disease

## 2021-08-25 DIAGNOSIS — G4733 Obstructive sleep apnea (adult) (pediatric): Secondary | ICD-10-CM

## 2021-08-25 NOTE — Telephone Encounter (Signed)
HST 08/21/21 >> AHI 51.3, SpO2 low 85%   Please inform him that his sleep study shows severe obstructive sleep apnea.  Please arrange for ROV with me or NP to discuss treatment options.

## 2021-08-25 NOTE — Telephone Encounter (Signed)
ATC patient.  LMTCB. 

## 2021-08-28 NOTE — Telephone Encounter (Signed)
Called and spoke to patient. Went over results and he voiced understanding. Patient was curious to know what the AHI and O2 levels should be during his sleep and I informed him that an AHI of less than 5 and O2 levels above 90% are what would be considered normal. He voiced understanding and asked that his results be sent to him on mychart. Made an appt for 8/21 at 1:45 pm with Dr. Craige Cotta to discuss treatment options. Nothing further needed at this time.

## 2021-08-28 NOTE — Telephone Encounter (Signed)
Pt returning call.  [phone].  Available at this number all day.

## 2021-09-01 ENCOUNTER — Encounter: Payer: Self-pay | Admitting: Family Medicine

## 2021-09-01 ENCOUNTER — Ambulatory Visit: Payer: No Typology Code available for payment source | Admitting: Family Medicine

## 2021-09-01 ENCOUNTER — Telehealth (INDEPENDENT_AMBULATORY_CARE_PROVIDER_SITE_OTHER): Payer: No Typology Code available for payment source | Admitting: Family Medicine

## 2021-09-01 DIAGNOSIS — G4733 Obstructive sleep apnea (adult) (pediatric): Secondary | ICD-10-CM | POA: Diagnosis not present

## 2021-09-01 DIAGNOSIS — N401 Enlarged prostate with lower urinary tract symptoms: Secondary | ICD-10-CM | POA: Diagnosis not present

## 2021-09-01 DIAGNOSIS — N400 Enlarged prostate without lower urinary tract symptoms: Secondary | ICD-10-CM | POA: Insufficient documentation

## 2021-09-01 MED ORDER — ROSUVASTATIN CALCIUM 20 MG PO TABS: 20.0000 mg | ORAL_TABLET | Freq: Every day | ORAL | 3 refills | Status: DC

## 2021-09-01 NOTE — Progress Notes (Signed)
Patient ID: Curtis Franklin, male   DOB: 08/14/1965, 56 y.o.   MRN: [id number]   Virtual Visit via Video Note  I connected with Curtis Franklin on 09/01/21 at  8:30 AM EDT by a video enabled telemedicine application and verified that I am speaking with the correct person using two identifiers.  Location patient: home Location provider:work or home office Persons participating in the virtual visit: patient, provider  I discussed the limitations of evaluation and management by telemedicine and the availability of in person appointments. The patient expressed understanding and agreed to proceed.   HPI:  Curtis Franklin to bring [place] up-to-date regarding recent medical items.  He has history of BPH and has seen urologist.  Had substantial symptoms even after starting Flomax.  When he saw urologist he was also added finasteride.  He had CT scan which showed question of bladder lesion with subsequent cystoscopy which did not show any concerns.  He was still having considerable sleep disruption with urine frequency and nocturia even on finasteride and Flomax.  Urologist had concerns about possible obstructive sleep apnea.  He had sleep study through pulmonary which showed severe obstructive sleep apnea.  He has follow-up in a few days to discuss further with them.  He has been very attentive to exercise and diet over the past several weeks.  He takes rosuvastatin for hyperlipidemia and requesting refills.  He had his last full labs in October.   ROS: See pertinent positives and negatives per HPI.  Past Medical History:  Diagnosis Date   HYPERLIPIDEMIA 09/12/2006   Qualifier: Diagnosis of  By: Tawanna Cooler RN, Ellen     Prostatitis, acute 06/18/2011    Past Surgical History:  Procedure Laterality Date   NO PAST SURGERIES      Family History  Problem Relation Age of Onset   Hypertension Mother    Hypothyroidism Mother    Dementia Father    Diabetes Maternal Grandfather    Hyperlipidemia Other      SOCIAL HX: Non-smoker   Current Outpatient Medications:    finasteride (PROSCAR) 5 MG tablet, Take 5 mg by mouth daily., Disp: , Rfl:    tamsulosin (FLOMAX) 0.4 MG CAPS capsule, Take 1 capsule (0.4 mg total) by mouth daily., Disp: 30 capsule, Rfl: 3   rosuvastatin (CRESTOR) 20 MG tablet, Take 1 tablet (20 mg total) by mouth daily., Disp: 90 tablet, Rfl: 3  Current Facility-Administered Medications:    0.9 %  sodium chloride infusion, 500 mL, Intravenous, Continuous, Russella Dar, Venita Lick, MD  EXAM:  VITALS per patient if applicable:  GENERAL: alert, oriented, appears well and in no acute distress  HEENT: atraumatic, conjunttiva clear, no obvious abnormalities on inspection of external nose and ears  NECK: normal movements of the head and neck  LUNGS: on inspection no signs of respiratory distress, breathing rate appears normal, no obvious gross SOB, gasping or wheezing  CV: no obvious cyanosis  MS: moves all visible extremities without noticeable abnormality  PSYCH/NEURO: pleasant and cooperative, no obvious depression or anxiety, speech and thought processing grossly intact  ASSESSMENT AND PLAN:  Discussed the following assessment and plan:  Obstructive sleep apnea  Benign prostatic hyperplasia with lower urinary tract symptoms, symptom details unspecified  -Refilled rosuvastatin -Set up complete physical around October or November and plan lab work then -Continue close follow-up with pulmonary regarding his abnormal sleep studies -Continue finasteride and Flomax for his BPH.  Reminder to avoid anticholinergic medications.   I discussed the assessment and treatment plan with  the patient. The patient was provided an opportunity to ask questions and all were answered. The patient agreed with the plan and demonstrated an understanding of the instructions.   The patient was advised to call back or seek an in-person evaluation if the symptoms worsen or if the condition fails  to improve as anticipated.     Evelena Peat, MD

## 2021-09-04 ENCOUNTER — Encounter: Payer: Self-pay | Admitting: Pulmonary Disease

## 2021-09-04 ENCOUNTER — Ambulatory Visit (INDEPENDENT_AMBULATORY_CARE_PROVIDER_SITE_OTHER): Payer: No Typology Code available for payment source | Admitting: Pulmonary Disease

## 2021-09-04 VITALS — BP 110/70 | HR 93 | Ht 60.0 in | Wt 155.2 lb

## 2021-09-04 DIAGNOSIS — G4733 Obstructive sleep apnea (adult) (pediatric): Secondary | ICD-10-CM | POA: Diagnosis not present

## 2021-09-04 DIAGNOSIS — Z7189 Other specified counseling: Secondary | ICD-10-CM | POA: Diagnosis not present

## 2021-09-04 NOTE — Progress Notes (Signed)
   [facility]  Chief Complaint  Patient presents with   Follow-up    Follow-up    Past Surgical History:  He  has a past surgical history that includes No past surgeries.  Past Medical History:  HLD  Constitutional:  BP 110/70 (BP Location: Left Arm)   Pulse 93   Ht 5' (1.524 m)   Wt 155 lb 3.2 oz (70.4 kg)   SpO2 96%   BMI 30.31 kg/m   Brief Summary:  Curtis Franklin is a 56 y.o. male with obstructive sleep apnea      Subjective:   His home sleep study showed severe sleep apnea.  Physical Exam:   Appearance - well kempt   ENMT - no sinus tenderness, no oral exudate, no LAN, Mallampati 2 airway, no stridor, low laying soft palate, elongated uvula  Respiratory - equal breath sounds bilaterally, no wheezing or rales  CV - s1s2 regular rate and rhythm, no murmurs  Ext - no clubbing, no edema  Skin - no rashes  Psych - normal mood and affect   Sleep Tests:  HST 08/21/21 >> AHI 51.3, SpO2 low 85%  Social History:  He  reports that he quit smoking about 18 years ago. His smoking use included cigarettes. He has never used smokeless tobacco. He reports current alcohol use of about 2.0 standard drinks of alcohol per week. He reports that he does not use drugs.  Family History:  His family history includes Dementia in his father; Diabetes in his maternal grandfather; Hyperlipidemia in an other family member; Hypertension in his mother; Hypothyroidism in his mother.     Assessment/Plan:   Obstructive sleep apnea. - sleep study reviewed - treatment options discussed - reviewed how sleep apnea can impact his health - will arrange for auto CPAP set up - he might get a travel machine for work travel  Nocturia. - reviewed how sympathetic discharge related to apneic events can contribute to this  Obesity. - discussed how weight can impact sleep and risk for sleep disordered breathing - discussed options to assist  with weight loss: combination of diet modification, cardiovascular and strength training exercises  Time Spent Involved in Patient Care on Day of Examination:  26 minutes  Follow up:   Patient Instructions  Will arrange for auto CPAP set up  Follow up in 4 months  Medication List:   Allergies as of 09/04/2021   No Known Allergies      Medication List        Accurate as of September 04, 2021  2:04 PM. If you have any questions, ask your nurse or doctor.          finasteride 5 MG tablet Commonly known as: PROSCAR Take 5 mg by mouth daily.   rosuvastatin 20 MG tablet Commonly known as: Crestor Take 1 tablet (20 mg total) by mouth daily.   tamsulosin 0.4 MG Caps capsule Commonly known as: FLOMAX Take 1 capsule (0.4 mg total) by mouth daily.        Signature:  Coralyn Helling, MD [facility] Pager - [phone] 09/04/2021, 2:04 PM

## 2021-09-04 NOTE — Patient Instructions (Signed)
Will arrange for auto CPAP set up  Follow up in 4 months 

## 2021-12-04 ENCOUNTER — Other Ambulatory Visit: Payer: Self-pay | Admitting: Urology

## 2021-12-04 DIAGNOSIS — N281 Cyst of kidney, acquired: Secondary | ICD-10-CM

## 2021-12-25 ENCOUNTER — Encounter (HOSPITAL_BASED_OUTPATIENT_CLINIC_OR_DEPARTMENT_OTHER): Payer: Self-pay | Admitting: Emergency Medicine

## 2021-12-25 ENCOUNTER — Emergency Department (HOSPITAL_BASED_OUTPATIENT_CLINIC_OR_DEPARTMENT_OTHER): Payer: No Typology Code available for payment source

## 2021-12-25 ENCOUNTER — Other Ambulatory Visit: Payer: Self-pay

## 2021-12-25 ENCOUNTER — Emergency Department (HOSPITAL_BASED_OUTPATIENT_CLINIC_OR_DEPARTMENT_OTHER)
Admission: EM | Admit: 2021-12-25 | Discharge: 2021-12-25 | Disposition: A | Discharge status: Home / Self Care | Payer: No Typology Code available for payment source | Attending: Emergency Medicine | Admitting: Emergency Medicine

## 2021-12-25 DIAGNOSIS — R7309 Other abnormal glucose: Secondary | ICD-10-CM | POA: Diagnosis not present

## 2021-12-25 DIAGNOSIS — Z20822 Contact with and (suspected) exposure to covid-19: Secondary | ICD-10-CM | POA: Insufficient documentation

## 2021-12-25 DIAGNOSIS — Z87891 Personal history of nicotine dependence: Secondary | ICD-10-CM | POA: Insufficient documentation

## 2021-12-25 DIAGNOSIS — R55 Syncope and collapse: Secondary | ICD-10-CM | POA: Diagnosis not present

## 2021-12-25 DIAGNOSIS — B974 Respiratory syncytial virus as the cause of diseases classified elsewhere: Secondary | ICD-10-CM | POA: Diagnosis not present

## 2021-12-25 DIAGNOSIS — E871 Hypo-osmolality and hyponatremia: Secondary | ICD-10-CM | POA: Insufficient documentation

## 2021-12-25 DIAGNOSIS — B338 Other specified viral diseases: Secondary | ICD-10-CM

## 2021-12-25 DIAGNOSIS — R8289 Other abnormal findings on cytological and histological examination of urine: Secondary | ICD-10-CM | POA: Insufficient documentation

## 2021-12-25 LAB — BASIC METABOLIC PANEL
Anion gap: 9 (ref 5–15)
BUN: 12 mg/dL (ref 6–20)
CO2: 20 mmol/L — ABNORMAL LOW (ref 22–32)
Calcium: 8.4 mg/dL — ABNORMAL LOW (ref 8.9–10.3)
Chloride: 104 mmol/L (ref 98–111)
Creatinine, Ser: 1.16 mg/dL (ref 0.61–1.24)
GFR, Estimated: >60 mL/min (ref >60)
Glucose, Bld: 112 mg/dL — ABNORMAL HIGH (ref 70–99)
Potassium: 3.9 mmol/L (ref 3.5–5.1)
Sodium: 133 mmol/L — ABNORMAL LOW (ref 135–145)

## 2021-12-25 LAB — URINALYSIS, ROUTINE W REFLEX MICROSCOPIC
Bilirubin Urine: NEGATIVE
Glucose, UA: NEGATIVE mg/dL
Ketones, ur: NEGATIVE mg/dL
Leukocytes,Ua: NEGATIVE
Nitrite: NEGATIVE
Protein, ur: 30 mg/dL — AB
Specific Gravity, Urine: >=1.03 (ref 1.005–1.030)
pH: 5.5 (ref 5.0–8.0)

## 2021-12-25 LAB — CBC WITH DIFFERENTIAL/PLATELET
Abs Immature Granulocytes: 0.04 10*3/uL (ref 0.00–0.07)
Basophils Absolute: 0 10*3/uL (ref 0.0–0.1)
Basophils Relative: 1 %
Eosinophils Absolute: 0.2 10*3/uL (ref 0.0–0.5)
Eosinophils Relative: 3 %
HCT: 43.8 % (ref 39.0–52.0)
Hemoglobin: 14.2 g/dL (ref 13.0–17.0)
Immature Granulocytes: 1 %
Lymphocytes Relative: 21 %
Lymphs Abs: 1.7 10*3/uL (ref 0.7–4.0)
MCH: 28.2 pg (ref 26.0–34.0)
MCHC: 32.4 g/dL (ref 30.0–36.0)
MCV: 87.1 fL (ref 80.0–100.0)
Monocytes Absolute: 0.8 10*3/uL (ref 0.1–1.0)
Monocytes Relative: 10 %
Neutro Abs: 5.3 10*3/uL (ref 1.7–7.7)
Neutrophils Relative %: 64 %
Platelets: 216 10*3/uL (ref 150–400)
RBC: 5.03 MIL/uL (ref 4.22–5.81)
RDW: 13.6 % (ref 11.5–15.5)
WBC: 8 10*3/uL (ref 4.0–10.5)
nRBC: 0 % (ref 0.0–0.2)

## 2021-12-25 LAB — RESP PANEL BY RT-PCR (RSV, FLU A&B, COVID)  RVPGX2
Influenza A by PCR: NEGATIVE
Influenza B by PCR: NEGATIVE
Resp Syncytial Virus by PCR: POSITIVE — AB
SARS Coronavirus 2 by RT PCR: NEGATIVE

## 2021-12-25 LAB — URINALYSIS, MICROSCOPIC (REFLEX)

## 2021-12-25 LAB — TROPONIN I (HIGH SENSITIVITY): Troponin I (High Sensitivity): 4 ng/L (ref <18)

## 2021-12-25 LAB — D-DIMER, QUANTITATIVE: D-Dimer, Quant: 0.4 ug/mL-FEU (ref 0.00–0.50)

## 2021-12-25 NOTE — ED Notes (Signed)
Patient transported to X-ray 

## 2021-12-25 NOTE — ED Provider Notes (Signed)
Blood pressure (!) 141/120, pulse 71, temperature 98.3 F (36.8 C), temperature source Oral, resp. rate 20, SpO2 99 %.  Assuming care from Dr. Read Drivers.  In short, Curtis Franklin is a 56 y.o. male with a chief complaint of Near Syncope .  Refer to the original H&P for additional details.  The current plan of care is to follow up on labs and reassess.   EKG Interpretation  Date/Time:  Monday December 25 2021 06:19:20 EST Ventricular Rate:  74 PR Interval:  126 QRS Duration: 86 QT Interval:  381 QTC Calculation: 423 R Axis:   15 Text Interpretation: Sinus rhythm Abnormal R-wave progression, early transition No previous ECGs available Confirmed by Molpus, John ([id number]) on 12/25/2021 6:24:06 AM       07:37 AM  UA shows many bacteria but no WBCs, nitrite, leukocytes.  Plan to send this for culture but will hold on antibiotics without significant symptoms.  No leukocytosis on CBC or severe anemia.  Very mild hyponatremia with creatinine being normal.  Blood sugar slightly elevated at 112.  D-dimer normal at 0.4. Troponin normal. CXR independently interpreted and agree with radiology interpretation. RSV positive.   On reevaluation the patient is looking very well.  No increased work of breathing or hypoxemia.  Discussed his RSV positive result.  He will wear a mask and maintain distance and quarantine if able.  Discussed supportive care and hydration.  Discussed ED return precautions.    Maia Plan, MD 12/25/21 [phone]

## 2021-12-25 NOTE — ED Triage Notes (Signed)
Patient from [facility], just came back from [place] on Saturday, getting ready to fly to [place], states that he was having an episode of weakness, diaphoresis, dizziness and dry mouth.  Patient denies any chest pain and shortness of breath.  No abdominal pain.  Patient is CAOx4, walked in from ambulance.  Patient states that the diaphoresis and dizziness subsided.

## 2021-12-25 NOTE — Discharge Instructions (Signed)
You were seen in the emergency department today with lightheadedness.  You have tested positive for RSV which is a virus that causes the common cold.  If you develop shortness of breath or chest pain you should be reevaluated otherwise please manage her symptoms with over-the-counter medications, fluids, rest.  Please wear a mask when he will be around others and avoid contact with other individuals if you are able.

## 2021-12-25 NOTE — ED Provider Notes (Signed)
[facility] Provider Note: Lowella Dell, MD, FACEP  CSN: [id number] MRN: [id number] ARRIVAL: 12/25/21 at 0617 ROOM: [facility]06/[facility]06   CHIEF COMPLAINT  Near Syncope   HISTORY OF PRESENT ILLNESS  12/25/21 6:25 AM Curtis Franklin is a 56 y.o. male who recently traveled to [place], returning 2 days ago.  He has had a cough and some general malaise for the past several days.  He was at the airport just prior to arrival awaiting a flight to [place].  He had about a 20-minute episode of lightheadedness and diaphoresis.  He did not actually pass out.  He did not have chest pain or shortness of breath with this.  He no longer feels lightheaded or diaphoretic but continues to feel generally weak with a dry mouth.  He is in no pain.   Past Medical History:  Diagnosis Date   HYPERLIPIDEMIA 09/12/2006   Qualifier: Diagnosis of  By: Tawanna Cooler RN, Ellen     Prostatitis, acute 06/18/2011    Past Surgical History:  Procedure Laterality Date   NO PAST SURGERIES      Family History  Problem Relation Age of Onset   Hypertension Mother    Hypothyroidism Mother    Dementia Father    Diabetes Maternal Grandfather    Hyperlipidemia Other     Social History   Tobacco Use   Smoking status: Former    Types: Cigarettes    Quit date: 03/09/2003    Years since quitting: 18.8   Smokeless tobacco: Never  Substance Use Topics   Alcohol use: Yes    Alcohol/week: 2.0 standard drinks of alcohol    Types: 2 Glasses of wine per week   Drug use: No    Prior to Admission medications   Medication Sig Start Date End Date Taking? Authorizing Provider  finasteride (PROSCAR) 5 MG tablet Take 5 mg by mouth daily. 04/24/21   [provider]  rosuvastatin (CRESTOR) 20 MG tablet Take 1 tablet (20 mg total) by mouth daily. 09/01/21   Burchette, Elberta Fortis, MD  tamsulosin (FLOMAX) 0.4 MG CAPS capsule Take 1 capsule (0.4 mg total) by mouth daily. 10/04/20   Burchette, Elberta Fortis, MD     Allergies Patient has no known allergies.   REVIEW OF SYSTEMS  Negative except as noted here or in the History of Present Illness.   PHYSICAL EXAMINATION  Initial Vital Signs Blood pressure (!) 141/120, pulse 71, temperature 98.3 F (36.8 C), temperature source Oral, resp. rate 20, SpO2 99 %.  Examination General: Well-developed, well-nourished male in no acute distress; appearance consistent with age of record HENT: normocephalic; atraumatic Eyes: pupils equal, round and reactive to light; extraocular muscles intact Neck: supple Heart: regular rate and rhythm Lungs: clear to auscultation bilaterally Abdomen: soft; nondistended; nontender; bowel sounds present Extremities: No deformity; full range of motion; pulses normal Neurologic: Awake, alert and oriented; motor function intact in all extremities and symmetric; no facial droop Skin: Warm and dry Psychiatric: Normal mood and affect   RESULTS  Summary of this visit's results, reviewed and interpreted by myself:   EKG Interpretation  Date/Time:  Monday December 25 2021 06:19:20 EST Ventricular Rate:  74 PR Interval:  126 QRS Duration: 86 QT Interval:  381 QTC Calculation: 423 R Axis:   15 Text Interpretation: Sinus rhythm Abnormal R-wave progression, early transition No previous ECGs available Confirmed by Paula Libra ([id number]) on 12/25/2021 6:24:06 AM       Laboratory Studies: Results for orders placed or performed  during the hospital encounter of 12/25/21 (from the past 24 hour(s))  CBC with Differential     Status: None   Collection Time: 12/25/21  6:34 AM  Result Value Ref Range   WBC 8.0 4.0 - 10.5 K/uL   RBC 5.03 4.22 - 5.81 MIL/uL   Hemoglobin 14.2 13.0 - 17.0 g/dL   HCT 24.4 01.0 - 27.2 %   MCV 87.1 80.0 - 100.0 fL   MCH 28.2 26.0 - 34.0 pg   MCHC 32.4 30.0 - 36.0 g/dL   RDW 53.6 64.4 - 03.4 %   Platelets 216 150 - 400 K/uL   nRBC 0.0 0.0 - 0.2 %   Neutrophils Relative % 64 %   Neutro Abs 5.3  1.7 - 7.7 K/uL   Lymphocytes Relative 21 %   Lymphs Abs 1.7 0.7 - 4.0 K/uL   Monocytes Relative 10 %   Monocytes Absolute 0.8 0.1 - 1.0 K/uL   Eosinophils Relative 3 %   Eosinophils Absolute 0.2 0.0 - 0.5 K/uL   Basophils Relative 1 %   Basophils Absolute 0.0 0.0 - 0.1 K/uL   Immature Granulocytes 1 %   Abs Immature Granulocytes 0.04 0.00 - 0.07 K/uL  Basic metabolic panel     Status: Abnormal   Collection Time: 12/25/21  6:34 AM  Result Value Ref Range   Sodium 133 (L) 135 - 145 mmol/L   Potassium 3.9 3.5 - 5.1 mmol/L   Chloride 104 98 - 111 mmol/L   CO2 20 (L) 22 - 32 mmol/L   Glucose, Bld 112 (H) 70 - 99 mg/dL   BUN 12 6 - 20 mg/dL   Creatinine, Ser 7.42 0.61 - 1.24 mg/dL   Calcium 8.4 (L) 8.9 - 10.3 mg/dL   GFR, Estimated >59 >56 mL/min   Anion gap 9 5 - 15  D-dimer, quantitative     Status: None   Collection Time: 12/25/21  6:34 AM  Result Value Ref Range   D-Dimer, Quant 0.40 0.00 - 0.50 ug/mL-FEU  Troponin I (High Sensitivity)     Status: None   Collection Time: 12/25/21  6:34 AM  Result Value Ref Range   Troponin I (High Sensitivity) 4 <18 ng/L  Resp panel by RT-PCR (RSV, Flu A&B, Covid) Anterior Nasal Swab     Status: Abnormal   Collection Time: 12/25/21  6:34 AM   Specimen: Anterior Nasal Swab  Result Value Ref Range   SARS Coronavirus 2 by RT PCR NEGATIVE NEGATIVE   Influenza A by PCR NEGATIVE NEGATIVE   Influenza B by PCR NEGATIVE NEGATIVE   Resp Syncytial Virus by PCR POSITIVE (A) NEGATIVE  Urinalysis, Routine w reflex microscopic Urine, Clean Catch     Status: Abnormal   Collection Time: 12/25/21  6:45 AM  Result Value Ref Range   Color, Urine YELLOW YELLOW   APPearance CLEAR CLEAR   Specific Gravity, Urine >=1.030 1.005 - 1.030   pH 5.5 5.0 - 8.0   Glucose, UA NEGATIVE NEGATIVE mg/dL   Hgb urine dipstick MODERATE (A) NEGATIVE   Bilirubin Urine NEGATIVE NEGATIVE   Ketones, ur NEGATIVE NEGATIVE mg/dL   Protein, ur 30 (A) NEGATIVE mg/dL   Nitrite  NEGATIVE NEGATIVE   Leukocytes,Ua NEGATIVE NEGATIVE  Urinalysis, Microscopic (reflex)     Status: Abnormal   Collection Time: 12/25/21  6:45 AM  Result Value Ref Range   RBC / HPF 6-10 0 - 5 RBC/hpf   WBC, UA 0-5 0 - 5 WBC/hpf   Bacteria, UA  MANY (A) NONE SEEN   Squamous Epithelial / LPF 0-5 0 - 5   Mucus PRESENT    Hyaline Casts, UA PRESENT    Granular Casts, UA PRESENT    Imaging Studies: DG Chest 2 View  Result Date: 12/25/2021 CLINICAL DATA:  Cough EXAM: CHEST - 2 VIEW COMPARISON:  None Available. FINDINGS: Extensive artifact from EKG leads. Normal heart size and mediastinal contours. No acute infiltrate or edema. No effusion or pneumothorax. No acute osseous findings. IMPRESSION: No active cardiopulmonary disease. Electronically Signed   By: Tiburcio Pea M.D.   On: 12/25/2021 06:45    ED COURSE and MDM  Nursing notes, initial and subsequent vitals signs, including pulse oximetry, reviewed and interpreted by myself.  Vitals:   12/25/21 0622 12/25/21 0700 12/25/21 0730  BP: (!) 141/120 130/81 125/68  Pulse: 71 73 81  Resp: 20 (!) 24 (!) 26  Temp: 98.3 F (36.8 C)    TempSrc: Oral    SpO2: 99% 99% 99%   Medications - No data to display  7:00 AM Signed out to Dr. Jacqulyn Bath. Diagnostic studies pending.    PROCEDURES  Procedures   ED DIAGNOSES     ICD-10-CM   1. Near syncope  R55     2. RSV infection  B33.8          Dugan Vanhoesen, MD 12/26/21 [phone]

## 2021-12-25 NOTE — ED Notes (Signed)
Spoke with Okey Regal to add urine culture

## 2021-12-26 ENCOUNTER — Encounter: Payer: Self-pay | Admitting: Family Medicine

## 2021-12-26 ENCOUNTER — Telehealth (INDEPENDENT_AMBULATORY_CARE_PROVIDER_SITE_OTHER): Payer: No Typology Code available for payment source | Admitting: Family Medicine

## 2021-12-26 VITALS — Ht 60.0 in | Wt 155.2 lb

## 2021-12-26 DIAGNOSIS — J210 Acute bronchiolitis due to respiratory syncytial virus: Secondary | ICD-10-CM | POA: Diagnosis not present

## 2021-12-26 DIAGNOSIS — E785 Hyperlipidemia, unspecified: Secondary | ICD-10-CM

## 2021-12-26 LAB — URINE CULTURE: Culture: NO GROWTH

## 2021-12-26 MED ORDER — ROSUVASTATIN CALCIUM 10 MG PO TABS: 10.0000 mg | ORAL_TABLET | Freq: Every day | ORAL | 3 refills | Status: AC

## 2021-12-26 MED ORDER — ALBUTEROL SULFATE HFA 108 (90 BASE) MCG/ACT IN AERS: 2.0000 | INHALATION_SPRAY | Freq: Four times a day (QID) | RESPIRATORY_TRACT | 0 refills | Status: DC | PRN

## 2021-12-26 NOTE — Progress Notes (Signed)
Patient ID: Curtis Franklin, male   DOB: 08/11/1965, 56 y.o.   MRN: [id number]   Virtual Visit via Video Note  I connected with Allayne Stack on 12/26/21 at  2:45 PM EST by a video enabled telemedicine application and verified that I am speaking with the correct person using two identifiers.  Location patient: home Location provider:work or home office Persons participating in the virtual visit: patient, provider  I discussed the limitations of evaluation and management by telemedicine and the availability of in person appointments. The patient expressed understanding and agreed to proceed.   HPI:  Curtis Franklin set up this virtual visit to discuss several items as follows  He had recent positive test for RSV.  He just returned from trip to [place] this past Saturday.  He had developed a cough a couple days prior to leaving.  He was preparing Monday to travel again for work and when he was at the airport had some diaphoresis and cough and near syncope and went to the ER for further evaluation.  Testing there revealed positive RSV with negative COVID.  He had many bacteria on urinalysis but no significant leukocytes and urine culture negative.  Chest x-ray no pneumonia.  D-dimer 0.4.  CBC unremarkable.  Electrolytes unremarkable except for mildly low sodium of 133.  He does have some wheezing but overall doing fairly well.  His wife does not have any symptoms.  On another item that he has not been taking his Crestor regularly but would like to go back on 10 mg if possible.  Concerned about myalgias with 20 mg dose.  He also questions about coronary calcium score.  No known family history of CAD.  He had very high lipids with cholesterol over 300.  Non-smoker.  Past Medical History:  Diagnosis Date   HYPERLIPIDEMIA 09/12/2006   Qualifier: Diagnosis of  By: Tawanna Cooler RN, Ellen     Prostatitis, acute 06/18/2011   Past Surgical History:  Procedure Laterality Date   NO PAST SURGERIES      reports that  he quit smoking about 18 years ago. His smoking use included cigarettes. He has never used smokeless tobacco. He reports current alcohol use of about 2.0 standard drinks of alcohol per week. He reports that he does not use drugs. family history includes Dementia in his father; Diabetes in his maternal grandfather; Hyperlipidemia in an other family member; Hypertension in his mother; Hypothyroidism in his mother. No Known Allergies  The 10-year ASCVD risk score (Arnett DK, et al., 2019) is: 7.3%   Values used to calculate the score:     Age: 30 years     Sex: Male     Is Non-Hispanic African American: No     Diabetic: No     Tobacco smoker: No     Systolic Blood Pressure: 125 mmHg     Is BP treated: No     HDL Cholesterol: 63 mg/dL     Total Cholesterol: 311 mg/dL    ROS: See pertinent positives and negatives per HPI.  Past Medical History:  Diagnosis Date   HYPERLIPIDEMIA 09/12/2006   Qualifier: Diagnosis of  By: Tawanna Cooler RN, Ellen     Prostatitis, acute 06/18/2011    Past Surgical History:  Procedure Laterality Date   NO PAST SURGERIES      Family History  Problem Relation Age of Onset   Hypertension Mother    Hypothyroidism Mother    Dementia Father    Diabetes Maternal Grandfather    Hyperlipidemia  Other     SOCIAL HX: Non-smoker   Current Outpatient Medications:    albuterol (VENTOLIN HFA) 108 (90 Base) MCG/ACT inhaler, Inhale 2 puffs into the lungs every 6 (six) hours as needed for wheezing or shortness of breath., Disp: 8 g, Rfl: 0   rosuvastatin (CRESTOR) 10 MG tablet, Take 1 tablet (10 mg total) by mouth daily., Disp: 90 tablet, Rfl: 3   tamsulosin (FLOMAX) 0.4 MG CAPS capsule, Take 1 capsule (0.4 mg total) by mouth daily., Disp: 30 capsule, Rfl: 3   finasteride (PROSCAR) 5 MG tablet, Take 5 mg by mouth daily. (Patient not taking: Reported on 12/26/2021), Disp: , Rfl:   Current Facility-Administered Medications:    0.9 %  sodium chloride infusion, 500 mL,  Intravenous, Continuous, Russella Dar, Venita Lick, MD  EXAM:  VITALS per patient if applicable:  GENERAL: alert, oriented, appears well and in no acute distress  HEENT: atraumatic, conjunttiva clear, no obvious abnormalities on inspection of external nose and ears  NECK: normal movements of the head and neck  LUNGS: on inspection no signs of respiratory distress, breathing rate appears normal, no obvious gross SOB, gasping or wheezing  CV: no obvious cyanosis  MS: moves all visible extremities without noticeable abnormality  PSYCH/NEURO: pleasant and cooperative, no obvious depression or anxiety, speech and thought processing grossly intact  ASSESSMENT AND PLAN:  Discussed the following assessment and plan:  #1 respiratory syncytial virus.  We explained that treatment for most adults is supportive.  He does describe some intermittent wheezing.  Send in albuterol MDI 2 puffs every 4-6 hours as needed for cough/wheeze.  We have suggested he get home pulse oximeter and monitor and be in touch if O2 sats dropping below 90%.  Recent ER studies reviewed.  Chest x-ray no pneumonia.  D-dimer normal.  COVID testing negative.  #2 hyperlipidemia.  Will send in Crestor 10 mg daily.  Recommend follow-up fasting lipids in a couple months -He does express desire to consider coronary calcium score to further risk stratify.  Given his risk factors this seems reasonable.  Spent 30 minutes in face-to-face virtual visit and non-face-to-face time reviewing recent ER notes, labs, x-rays and discussing disposition/plan   I discussed the assessment and treatment plan with the patient. The patient was provided an opportunity to ask questions and all were answered. The patient agreed with the plan and demonstrated an understanding of the instructions.   The patient was advised to call back or seek an in-person evaluation if the symptoms worsen or if the condition fails to improve as anticipated.     Evelena Peat, MD

## 2022-01-04 ENCOUNTER — Ambulatory Visit (HOSPITAL_COMMUNITY)
Admission: RE | Admit: 2022-01-04 | Discharge: 2022-01-04 | Disposition: A | Discharge status: Home / Self Care | Payer: No Typology Code available for payment source | Source: Ambulatory Visit | Attending: Family Medicine | Admitting: Family Medicine

## 2022-01-04 DIAGNOSIS — E785 Hyperlipidemia, unspecified: Secondary | ICD-10-CM | POA: Insufficient documentation

## 2022-01-10 ENCOUNTER — Telehealth: Payer: Self-pay | Admitting: Family Medicine

## 2022-01-10 MED ORDER — TAMSULOSIN HCL 0.4 MG PO CAPS: 0.4000 mg | ORAL_CAPSULE | Freq: Every day | ORAL | 3 refills | Status: DC

## 2022-01-10 NOTE — Telephone Encounter (Signed)
Pt called to request a refill of the   tamsulosin (FLOMAX) 0.4 MG CAPS capsule    LOV:  12/26/21  [id number] [place], [place] - [address] Phone: [phone]  Fax: [phone]

## 2022-01-10 NOTE — Telephone Encounter (Signed)
Rx sent 

## 2022-01-12 ENCOUNTER — Ambulatory Visit (INDEPENDENT_AMBULATORY_CARE_PROVIDER_SITE_OTHER): Payer: No Typology Code available for payment source | Admitting: Pulmonary Disease

## 2022-01-12 ENCOUNTER — Encounter (HOSPITAL_BASED_OUTPATIENT_CLINIC_OR_DEPARTMENT_OTHER): Payer: Self-pay | Admitting: Pulmonary Disease

## 2022-01-12 VITALS — BP 116/66 | HR 71 | Temp 98.2°F | Ht 64.0 in | Wt 155.4 lb

## 2022-01-12 DIAGNOSIS — G4733 Obstructive sleep apnea (adult) (pediatric): Secondary | ICD-10-CM | POA: Diagnosis not present

## 2022-01-12 NOTE — Progress Notes (Signed)
[facility]  Chief Complaint  Patient presents with   Follow-up    Pt would like to discuss CPAP machine options. Pt states he has tried wearing but very uncomfortable.     Past Surgical History:  He  has a past surgical history that includes No past surgeries.  Past Medical History:  HLD  Constitutional:  BP 116/66 (BP Location: Left Arm, Patient Position: Sitting, Cuff Size: Normal)   Pulse 71   Temp 98.2 F (36.8 C) (Oral)   Ht 5\' 4"  (1.626 m)   Wt 155 lb 6.4 oz (70.5 kg)   SpO2 100%   BMI 26.67 kg/m   Brief Summary:  Curtis Franklin is a 56 y.o. male with obstructive sleep apnea      Subjective:   He has tried CPAP.  Having trouble with mask fit.  Has tried different size full face masks, and more recently nasal pillows.  He has been able to tolerate CPAP pressure.    He saw urology and started on additional medication for his prostate.  This has helped reduced frequency of nighttime bathroom trips.  He has not seen a dentist in several years.  Physical Exam:   Appearance - well kempt   ENMT - no sinus tenderness, no oral exudate, no LAN, Mallampati 2 airway, no stridor, low laying soft palate, elongated uvula  Respiratory - equal breath sounds bilaterally, no wheezing or rales  CV - s1s2 regular rate and rhythm, no murmurs  Ext - no clubbing, no edema  Skin - no rashes  Psych - normal mood and affect   Sleep Tests:  HST 08/21/21 >> AHI 51.3, SpO2 low 85%  Social History:  He  reports that he quit smoking about 18 years ago. His smoking use included cigarettes. He has never used smokeless tobacco. He reports current alcohol use of about 2.0 standard drinks of alcohol per week. He reports that he does not use drugs.  Family History:  His family history includes Dementia in his father; Diabetes in his maternal grandfather; Hyperlipidemia in an other family member; Hypertension in his mother; Hypothyroidism  in his mother.     Assessment/Plan:   Obstructive sleep apnea. - he has difficulty adjusting to CPAP due to mask fit - discussed options to help with mask fit - he will try using CPAP again - will arrange for a referral to Dr. 10/21/21 to see if he is a candidate for an oral appliance, and to establish with a general dentist - reviewed process of getting an Inspire device; he is reluctant to have any surgical intervention  Nocturia. - improved after recent medications adjustments by urology  Obesity. - discussed how weight can impact sleep and risk for sleep disordered breathing - discussed options to assist with weight loss: combination of diet modification, cardiovascular and strength training exercises  Time Spent Involved in Patient Care on Day of Examination:  27 minutes  Follow up:   Patient Instructions  Will arrange for referral to Dr. Irene Limbo to assess for an oral appliance to treat obstructive sleep apnea  Follow up in 6 months  Medication List:   Allergies as of 01/12/2022   No Known Allergies      Medication List        Accurate as of January 12, 2022 11:49 AM. If you have any questions, ask your nurse or doctor.          albuterol 108 (90 Base)  MCG/ACT inhaler Commonly known as: VENTOLIN HFA Inhale 2 puffs into the lungs every 6 (six) hours as needed for wheezing or shortness of breath.   finasteride 5 MG tablet Commonly known as: PROSCAR Take 5 mg by mouth daily.   rosuvastatin 10 MG tablet Commonly known as: Crestor Take 1 tablet (10 mg total) by mouth daily.   tamsulosin 0.4 MG Caps capsule Commonly known as: FLOMAX Take 1 capsule (0.4 mg total) by mouth daily.        Signature:  Coralyn Helling, MD [facility] Pager - [phone] 01/12/2022, 11:49 AM

## 2022-01-12 NOTE — Patient Instructions (Signed)
Will arrange for referral to Dr. Sandra Fuller to assess for an oral appliance to treat obstructive sleep apnea  Follow up in 6 months 

## 2022-01-23 ENCOUNTER — Other Ambulatory Visit: Payer: Self-pay | Admitting: Family Medicine

## 2022-02-06 ENCOUNTER — Other Ambulatory Visit: Payer: Self-pay | Admitting: Urology

## 2022-02-06 DIAGNOSIS — N281 Cyst of kidney, acquired: Secondary | ICD-10-CM

## 2022-02-09 ENCOUNTER — Ambulatory Visit (INDEPENDENT_AMBULATORY_CARE_PROVIDER_SITE_OTHER): Payer: No Typology Code available for payment source | Admitting: Family Medicine

## 2022-02-09 VITALS — BP 110/80 | HR 105 | Temp 98.7°F | Ht 64.0 in | Wt 157.0 lb

## 2022-02-09 DIAGNOSIS — E785 Hyperlipidemia, unspecified: Secondary | ICD-10-CM

## 2022-02-09 NOTE — Progress Notes (Signed)
Established Patient Office Visit  Subjective   Patient ID: Curtis Franklin, male    DOB: [birth year]  Age: 57 y.o. MRN: [id number]  Chief Complaint  Patient presents with   Medical Management of Chronic Issues    HPI   Curtis Franklin is here to discuss hyperlipidemia and also recent coronary calcium score.  He has past history of fairly severe hyperlipidemia with cholesterol 311 and triglycerides 207 with LDL cholesterol 210.  He does recently went on Crestor 10 mg once daily though not taking well consistently.  No side effects.  Coronary calcium score of 87.8 which is 78 percentile.  This was mostly left anterior descending with score of 57.  No family history of premature CAD.  No recent chest pains.  He has sleep apnea follow-up pulmonary and just recently went on a new mask which he is tolerating and planning on getting dental appliance soon.  History of BPH.  Close follow-up with urology.  He has pending MRI  Past Medical History:  Diagnosis Date   HYPERLIPIDEMIA 09/12/2006   Qualifier: Diagnosis of  By: Sherren Mocha RN, Ellen     Prostatitis, acute 06/18/2011   Past Surgical History:  Procedure Laterality Date   NO PAST SURGERIES      reports that he quit smoking about 18 years ago. His smoking use included cigarettes. He has never used smokeless tobacco. He reports current alcohol use of about 2.0 standard drinks of alcohol per week. He reports that he does not use drugs. family history includes Dementia in his father; Diabetes in his maternal grandfather; Hyperlipidemia in an other family member; Hypertension in his mother; Hypothyroidism in his mother. No Known Allergies  Review of Systems  Constitutional:  Negative for malaise/fatigue.  Eyes:  Negative for blurred vision.  Respiratory:  Negative for shortness of breath.   Cardiovascular:  Negative for chest pain.  Neurological:  Negative for dizziness, weakness and headaches.      Objective:     BP 110/80 (BP Location: Left  Arm, Patient Position: Sitting, Cuff Size: Normal)   Pulse (!) 105   Temp 98.7 F (37.1 C) (Oral)   Ht 5\' 4"  (1.626 m)   Wt 157 lb (71.2 kg)   SpO2 98%   BMI 26.95 kg/m    Physical Exam Vitals reviewed.  Constitutional:      Appearance: He is well-developed.  Eyes:     Pupils: Pupils are equal, round, and reactive to light.  Neck:     Thyroid: No thyromegaly.  Cardiovascular:     Rate and Rhythm: Normal rate and regular rhythm.  Pulmonary:     Effort: Pulmonary effort is normal. No respiratory distress.     Breath sounds: Normal breath sounds. No wheezing or rales.  Musculoskeletal:     Cervical back: Neck supple.  Neurological:     General: No focal deficit present.     Mental Status: He is alert.      No results found for any visits on 02/09/22.    The 10-year ASCVD risk score (Arnett DK, et al., 2019) is: 5.8%    Assessment & Plan:   Problem List Items Addressed This Visit       Unprioritized   Hyperlipidemia - Primary   Relevant Orders   Lipid panel   Hepatic function panel   Hepatic function panel   Lipid panel  Hyperlipidemia with recent coronary calcium score of 87. Discussed heart healthy diet.  Try to establish more consistent exercise.  Needs  follow-up fasting lipid and hepatic panel.  He prefers to give medication another month.  Future lab order placed  No follow-ups on file.    Carolann Littler, MD

## 2022-02-11 IMAGING — DX DG HAND COMPLETE 3+V*R*
3 series · 3 of 3 positions shown · non-contrast
Comparison: None.

CLINICAL DATA: Right thumb pain and thenar eminence edema status
post fall on outstretched hand last night.

EXAM:
RIGHT HAND - COMPLETE 3+ VIEW

[hand pa]
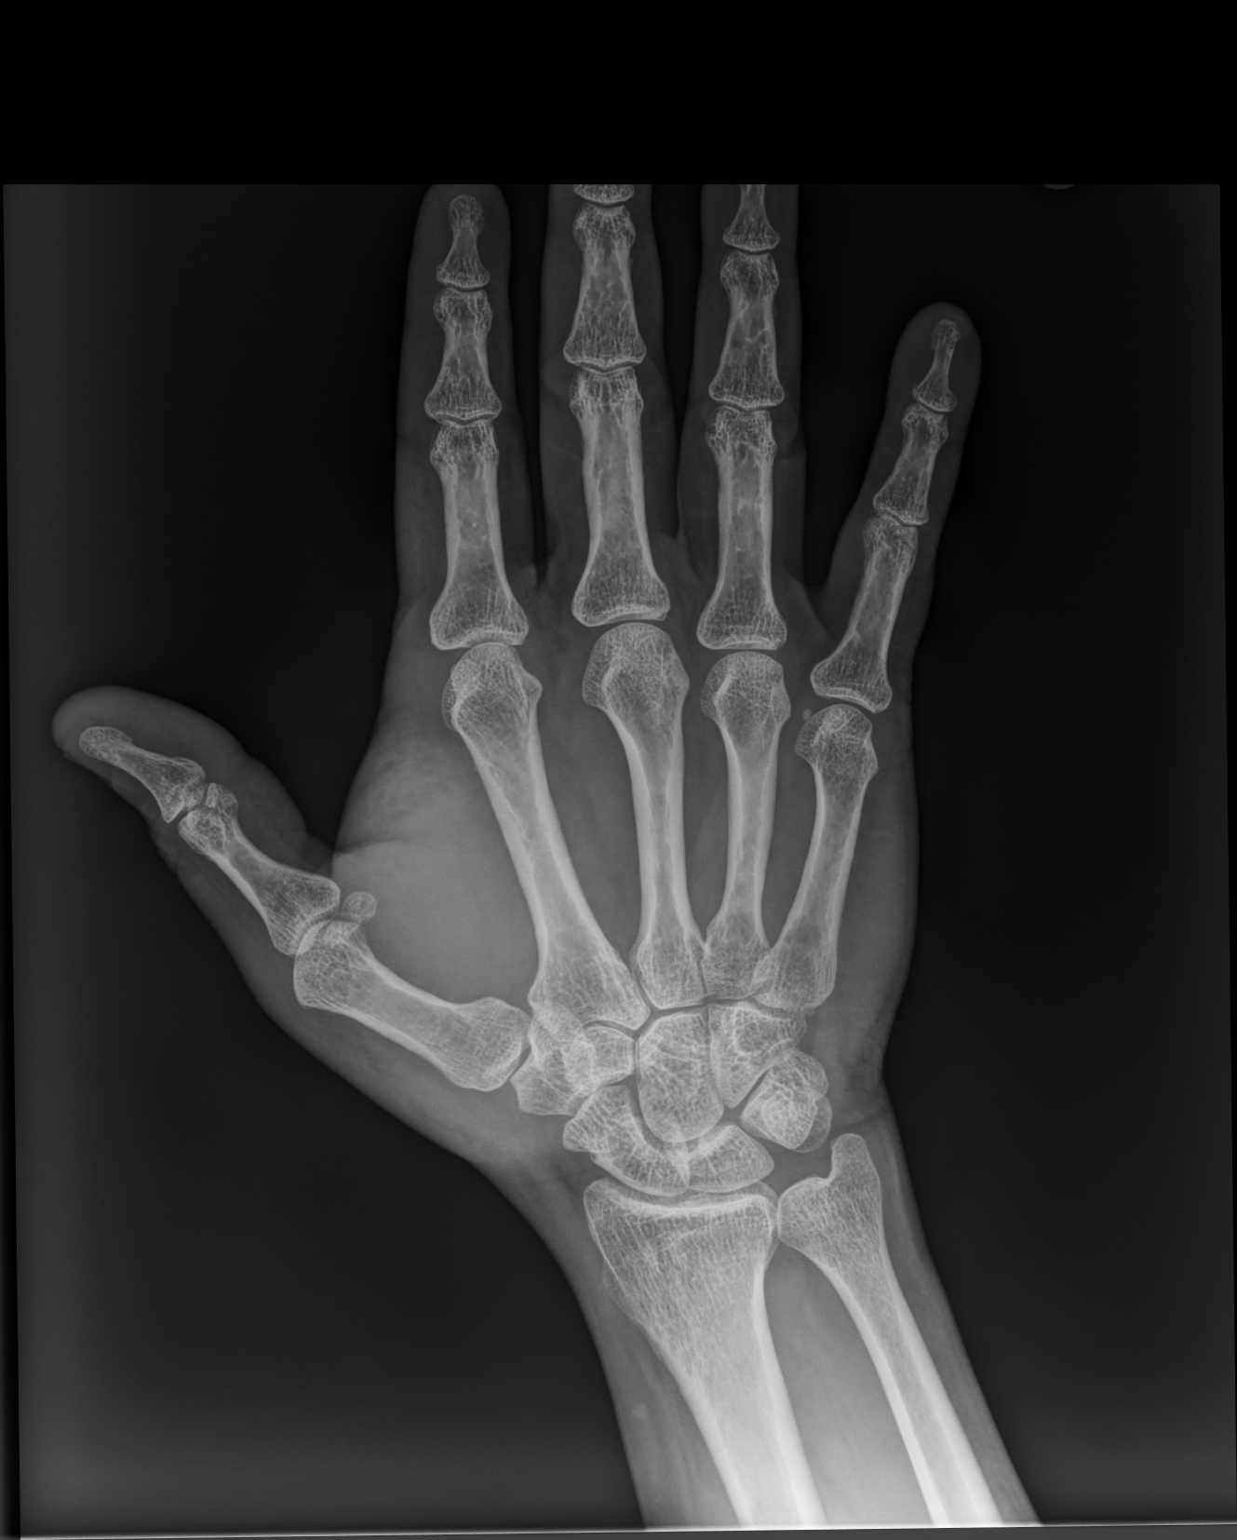

[hand mlo]
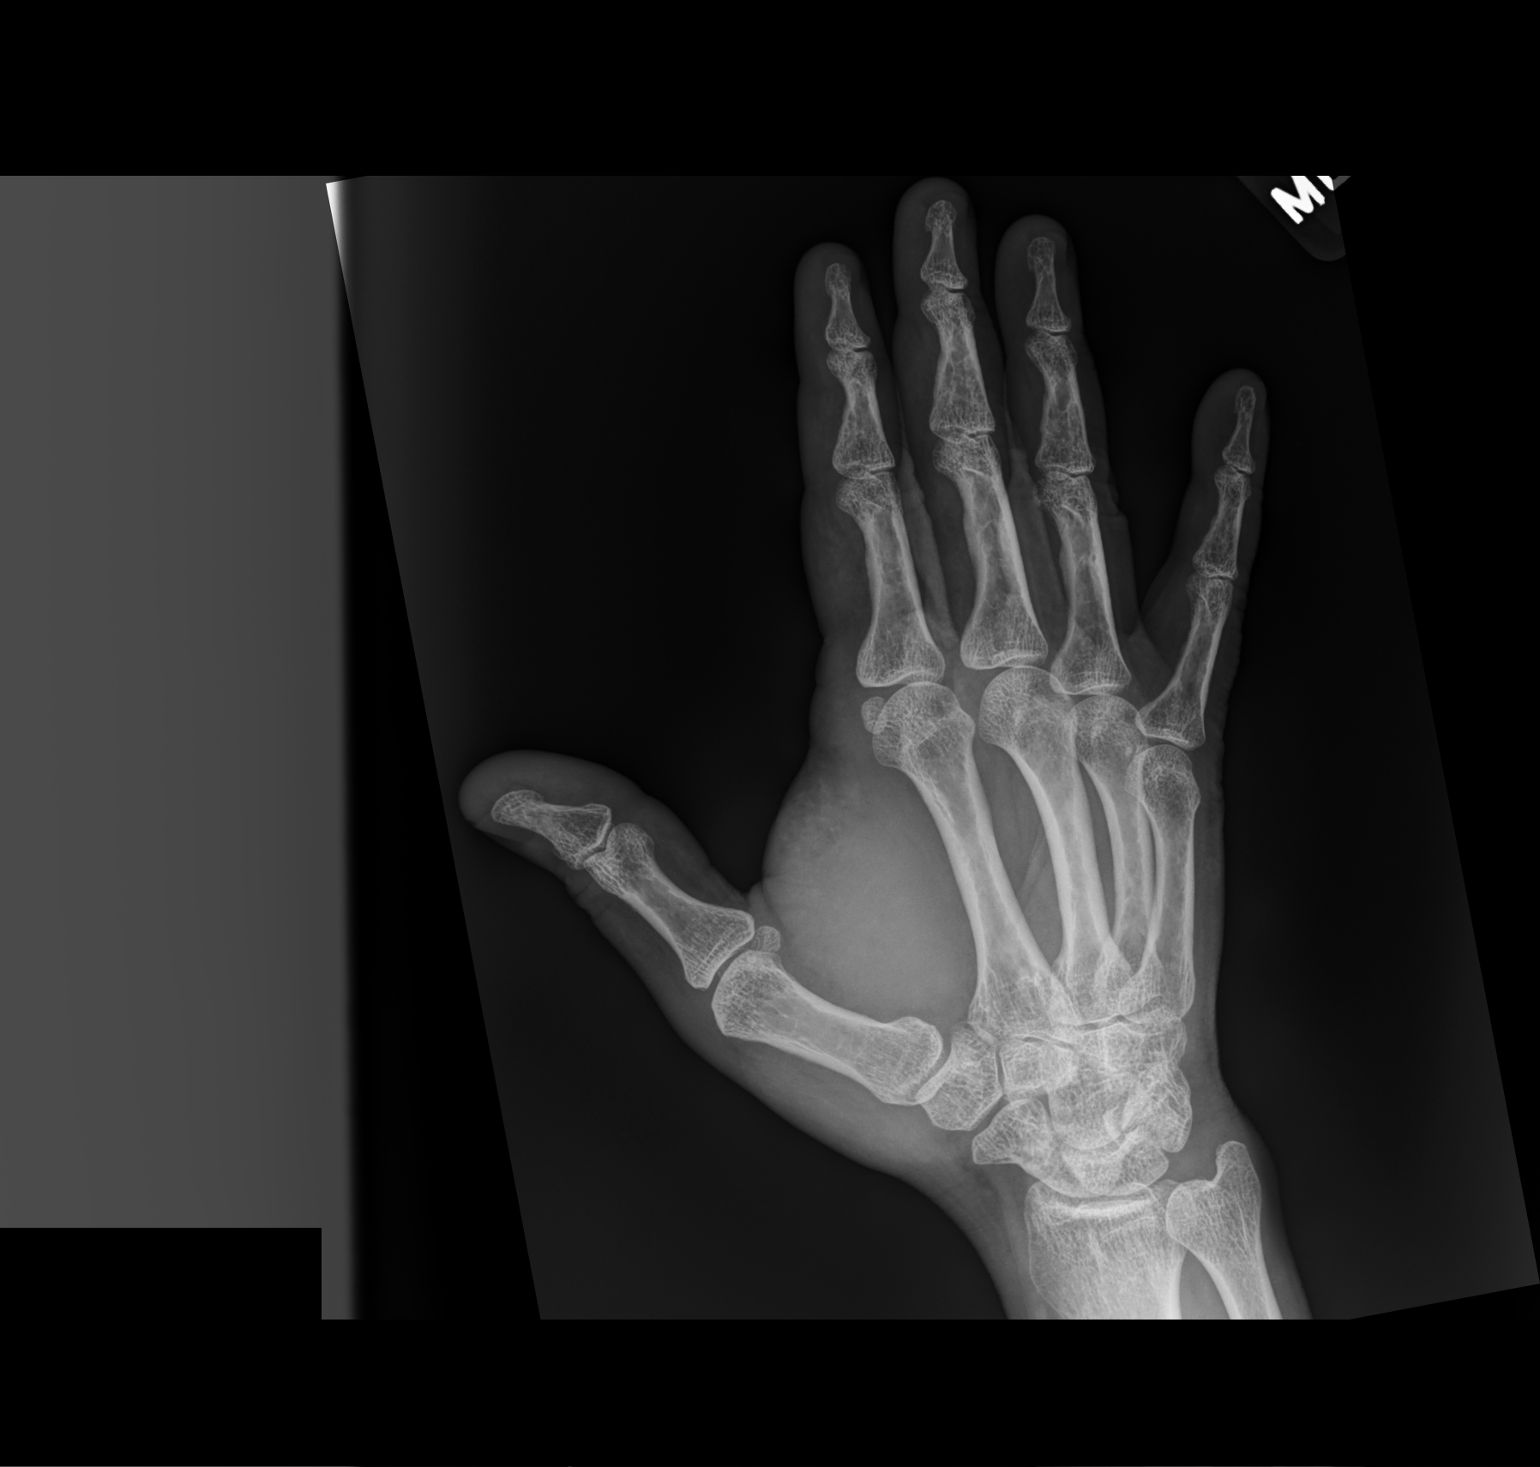

[hand lat]
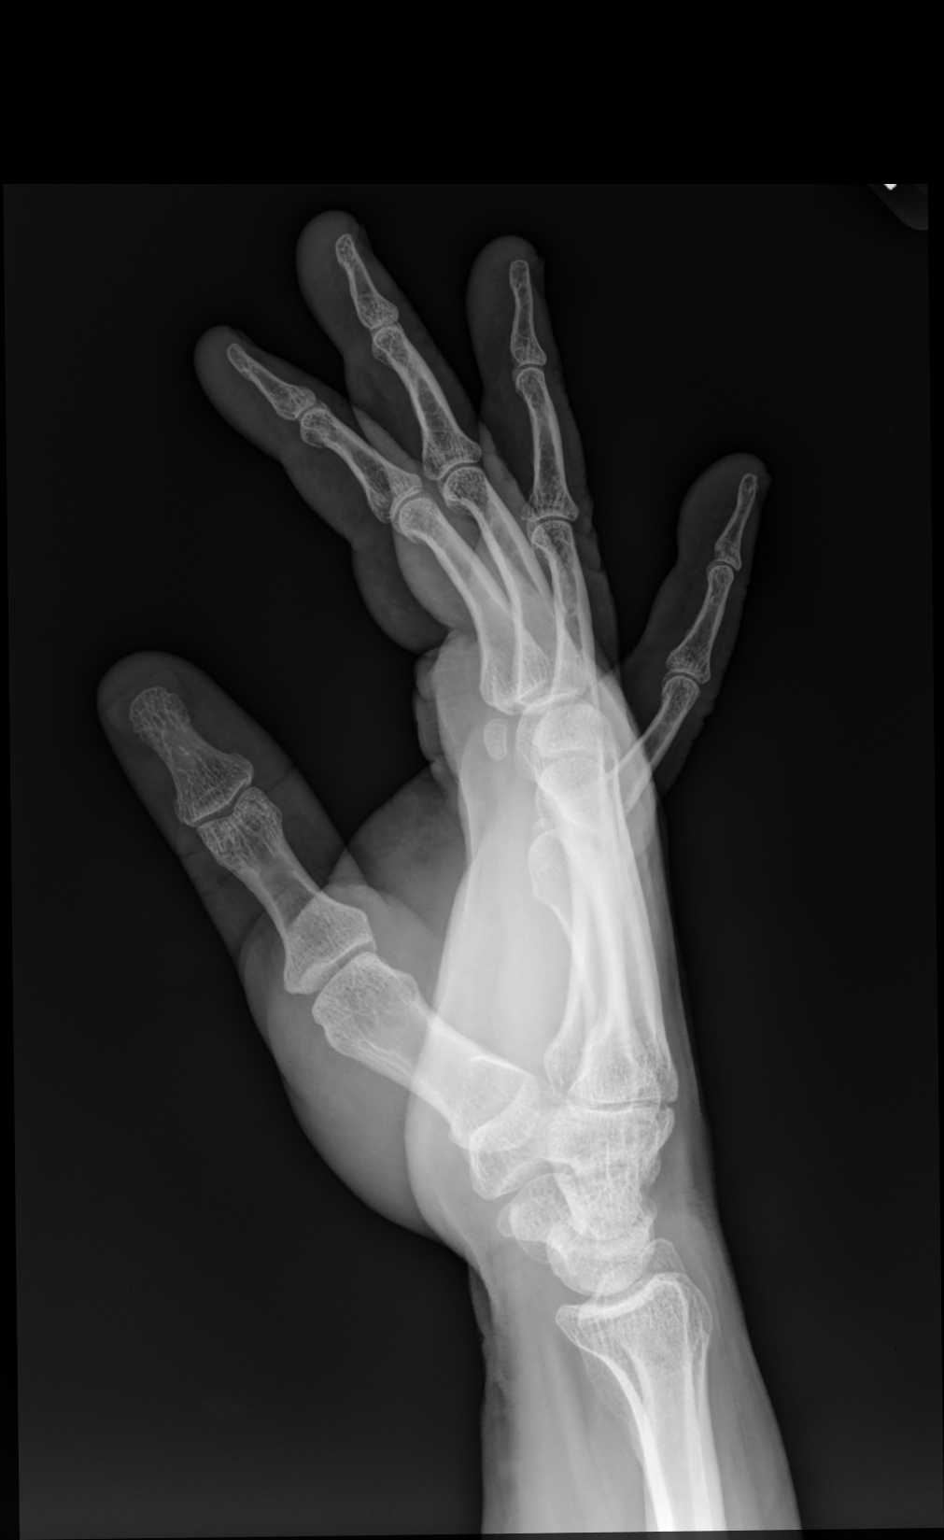

[3 of 3 positions shown; findings below may reference images not displayed]

FINDINGS: Soft tissue swelling is noted in the region of the thenar eminence.
No acute fracture or dislocation is identified. Joint space widths
are preserved.
IMPRESSION: Soft tissue swelling without acute osseous abnormality.

## 2022-03-01 ENCOUNTER — Encounter: Payer: Self-pay | Admitting: Urology

## 2022-03-06 ENCOUNTER — Ambulatory Visit
Admission: RE | Admit: 2022-03-06 | Discharge: 2022-03-06 | Disposition: A | Discharge status: Home / Self Care | Payer: No Typology Code available for payment source | Source: Ambulatory Visit | Attending: Urology | Admitting: Urology

## 2022-03-06 DIAGNOSIS — N281 Cyst of kidney, acquired: Secondary | ICD-10-CM

## 2022-03-06 MED ORDER — GADOPICLENOL 0.5 MMOL/ML IV SOLN
7.0000 mL | Freq: Once | INTRAVENOUS | Status: AC | PRN
  Administered 2022-03-06: 7 mL via INTRAVENOUS

## 2022-03-19 ENCOUNTER — Other Ambulatory Visit: Payer: Self-pay | Admitting: Family Medicine

## 2022-06-12 ENCOUNTER — Ambulatory Visit (INDEPENDENT_AMBULATORY_CARE_PROVIDER_SITE_OTHER): Payer: No Typology Code available for payment source | Admitting: Adult Health

## 2022-06-12 ENCOUNTER — Encounter: Payer: Self-pay | Admitting: Adult Health

## 2022-06-12 VITALS — BP 120/70 | HR 94 | Temp 98.1°F | Ht 64.0 in | Wt 154.8 lb

## 2022-06-12 DIAGNOSIS — J029 Acute pharyngitis, unspecified: Secondary | ICD-10-CM | POA: Diagnosis not present

## 2022-06-12 LAB — POC COVID19 BINAXNOW: SARS Coronavirus 2 Ag: NEGATIVE

## 2022-06-12 LAB — POCT RAPID STREP A (OFFICE): Rapid Strep A Screen: NEGATIVE

## 2022-06-12 NOTE — Progress Notes (Addendum)
Subjective:    Patient ID: Curtis Franklin, male    DOB: 1965/05/28, 57 y.o.   MRN: [id number]  Sore Throat  This is a new problem. The current episode started in the past 7 days (2 days). The problem has been unchanged. There has been no fever. Pertinent negatives include no congestion, coughing, diarrhea, ear pain, hoarse voice, shortness of breath, swollen glands, trouble swallowing or vomiting. He has had no exposure to strep or mono. He has tried gargles for the symptoms. The treatment provided no relief.    Review of Systems  HENT:  Negative for congestion, ear pain, hoarse voice and trouble swallowing.   Respiratory:  Negative for cough and shortness of breath.   Gastrointestinal:  Negative for diarrhea and vomiting.   Past Medical History:  Diagnosis Date   HYPERLIPIDEMIA 09/12/2006   Qualifier: Diagnosis of  By: Tawanna Cooler RN, Ellen     Prostatitis, acute 06/18/2011    Social History   Socioeconomic History   Marital status: Married    Spouse name: Not on file   Number of children: Not on file   Years of education: Not on file   Highest education level: Not on file  Occupational History   Not on file  Tobacco Use   Smoking status: Former    Types: Cigarettes    Quit date: 03/09/2003    Years since quitting: 19.2   Smokeless tobacco: Never  Substance and Sexual Activity   Alcohol use: Yes    Alcohol/week: 2.0 standard drinks of alcohol    Types: 2 Glasses of wine per week   Drug use: No   Sexual activity: Not on file  Other Topics Concern   Not on file  Social History Narrative   Not on file   Social Determinants of Health   Financial Resource Strain: Not on file  Food Insecurity: Not on file  Transportation Needs: Not on file  Physical Activity: Not on file  Stress: Not on file  Social Connections: Not on file  Intimate Partner Violence: Not on file    Past Surgical History:  Procedure Laterality Date   NO PAST SURGERIES      Family History   Problem Relation Age of Onset   Hypertension Mother    Hypothyroidism Mother    Dementia Father    Diabetes Maternal Grandfather    Hyperlipidemia Other     No Known Allergies  Current Outpatient Medications on File Prior to Visit  Medication Sig Dispense Refill   albuterol (VENTOLIN HFA) 108 (90 Base) MCG/ACT inhaler Inhale 2 puffs into the lungs every 6 (six) hours as needed for wheezing or shortness of breath. 8 g 0   finasteride (PROSCAR) 5 MG tablet Take 5 mg by mouth daily.     rosuvastatin (CRESTOR) 10 MG tablet Take 1 tablet (10 mg total) by mouth daily. 90 tablet 3   tamsulosin (FLOMAX) 0.4 MG CAPS capsule TAKE 1 CAPSULE BY MOUTH EVERY DAY 90 capsule 0   Current Facility-Administered Medications on File Prior to Visit  Medication Dose Route Frequency Provider Last Rate Last Admin   0.9 %  sodium chloride infusion  500 mL Intravenous Continuous Meryl Dare, MD        BP 120/70 (BP Location: Left Arm, Patient Position: Sitting, Cuff Size: Normal)   Pulse 94   Temp 98.1 F (36.7 C) (Oral)   Ht 5\' 4"  (1.626 m)   Wt 154 lb 12.8 oz (70.2 kg)   SpO2  98%   BMI 26.57 kg/m       Objective:   Physical Exam Vitals and nursing note reviewed.  Constitutional:      Appearance: Normal appearance. He is well-developed.  HENT:     Nose: No congestion or rhinorrhea.     Right Turbinates: Not enlarged or swollen.     Left Turbinates: Not enlarged or swollen.     Mouth/Throat:     Mouth: Mucous membranes are moist.     Tongue: No lesions.     Pharynx: Oropharynx is clear. Uvula midline. Posterior oropharyngeal erythema (very mild erthema) present. No pharyngeal swelling or oropharyngeal exudate.     Tonsils: No tonsillar exudate. 1+ on the right. 1+ on the left.  Cardiovascular:     Rate and Rhythm: Normal rate and regular rhythm.     Pulses: Normal pulses.     Heart sounds: Normal heart sounds.  Pulmonary:     Effort: Pulmonary effort is normal.     Breath sounds:  Normal breath sounds.  Musculoskeletal:        General: Normal range of motion.  Skin:    General: Skin is warm and dry.  Neurological:     General: No focal deficit present.     Mental Status: He is alert and oriented to person, place, and time.  Psychiatric:        Mood and Affect: Mood normal.        Behavior: Behavior normal.        Thought Content: Thought content normal.        Judgment: Judgment normal.       Assessment & Plan:  1. Sore throat - No signs of strep throat. Likely viral. Discussed a few days of prednisone but he would like to hold off for now. He will continue with salt water gargles and will start taking Advil  - Follow up as needed - POC COVID-19- Negative - POC Rapid Strep A- Negative   Shirline Frees, NP

## 2022-06-17 ENCOUNTER — Other Ambulatory Visit: Payer: Self-pay | Admitting: Family Medicine

## 2022-06-22 ENCOUNTER — Ambulatory Visit: Payer: No Typology Code available for payment source | Admitting: Family Medicine

## 2022-06-22 ENCOUNTER — Encounter: Payer: Self-pay | Admitting: Family Medicine

## 2022-06-22 VITALS — BP 126/70 | HR 82 | Temp 98.4°F | Ht 64.0 in | Wt 153.9 lb

## 2022-06-22 DIAGNOSIS — R430 Anosmia: Secondary | ICD-10-CM

## 2022-06-22 DIAGNOSIS — R062 Wheezing: Secondary | ICD-10-CM | POA: Diagnosis not present

## 2022-06-22 MED ORDER — TAMSULOSIN HCL 0.4 MG PO CAPS: 0.4000 mg | ORAL_CAPSULE | Freq: Every day | ORAL | 3 refills | Status: DC

## 2022-06-22 MED ORDER — PREDNISONE 20 MG PO TABS: ORAL_TABLET | ORAL | 0 refills | Status: AC

## 2022-06-22 MED ORDER — FINASTERIDE 5 MG PO TABS: 5.0000 mg | ORAL_TABLET | Freq: Every day | ORAL | 3 refills | Status: AC

## 2022-06-22 NOTE — Progress Notes (Signed)
Established Patient Office Visit  Subjective   Patient ID: Curtis Franklin, male    DOB: [birth year]  Age: 57 y.o. MRN: [id number]  Chief Complaint  Patient presents with   Cough    Patient complains of cough, Productive cough, Tried Robitussin   Smell    Patient complains of lack of smell, x4 days     HPI   Curtis Franklin is seen with chief complaint of loss of smell for the past 3 to 4 days.  He had upper respiratory symptoms with onset around May 26.  His wife had been in [place] on business trip and came back with cold symptoms.  She tested negative for COVID.  Patient tested negative for COVID here on the May 28.  He still has some nighttime cough which is sometimes productive.  Has some nasal congestion.  Interestingly, still has preservation of taste.  No fever.  Sore throat symptoms resolved.  Cough relatively mild at night and not interfering much with sleep.  No further fever.  No chronic sinusitis symptoms  Past Medical History:  Diagnosis Date   HYPERLIPIDEMIA 09/12/2006   Qualifier: Diagnosis of  By: Tawanna Cooler RN, Ellen     Prostatitis, acute 06/18/2011   Past Surgical History:  Procedure Laterality Date   NO PAST SURGERIES      reports that he quit smoking about 19 years ago. His smoking use included cigarettes. He has never used smokeless tobacco. He reports current alcohol use of about 2.0 standard drinks of alcohol per week. He reports that he does not use drugs. family history includes Dementia in his father; Diabetes in his maternal grandfather; Hyperlipidemia in an other family member; Hypertension in his mother; Hypothyroidism in his mother. No Known Allergies  Review of Systems  Constitutional:  Negative for chills and fever.  HENT:  Positive for congestion.   Respiratory:  Positive for cough and wheezing. Negative for shortness of breath.       Objective:     BP 126/70 (BP Location: Left Arm, Patient Position: Sitting, Cuff Size: Normal)   Pulse 82   Temp 98.4 F  (36.9 C) (Oral)   Ht 5\' 4"  (1.626 m)   Wt 153 lb 14.4 oz (69.8 kg)   SpO2 97%   BMI 26.42 kg/m  BP Readings from Last 3 Encounters:  06/22/22 126/70  06/12/22 120/70  02/09/22 110/80   Wt Readings from Last 3 Encounters:  06/22/22 153 lb 14.4 oz (69.8 kg)  06/12/22 154 lb 12.8 oz (70.2 kg)  02/09/22 157 lb (71.2 kg)      Physical Exam Vitals reviewed.  Constitutional:      General: He is not in acute distress.    Appearance: Normal appearance. He is not toxic-appearing.  HENT:     Mouth/Throat:     Mouth: Mucous membranes are moist.     Pharynx: Oropharynx is clear. No oropharyngeal exudate.  Cardiovascular:     Rate and Rhythm: Normal rate and regular rhythm.  Pulmonary:     Effort: Pulmonary effort is normal.     Comments: He has some scattered diffuse expiratory wheezes.  No rales.  No retractions.  O2 sats 97% room air Neurological:     Mental Status: He is alert.      No results found for any visits on 06/22/22.    The 10-year ASCVD risk score (Arnett DK, et al., 2019) is: 13.6%    Assessment & Plan:   Anosmia-following recent viral URI.  COVID testing  was negative.  He has not had any loss of taste.  Suspect his symptoms are related to recent URI.  He does have some reactive airway changes on exam with significant wheezing.  We suggested prednisone 20 mg 2 tablets daily for 5 days.  Suspect his smell will come back over the next couple weeks.  Be in touch if no improvement in the next 2 to 3 weeks  Evelena Peat, MD

## 2022-09-11 ENCOUNTER — Other Ambulatory Visit: Payer: Self-pay | Admitting: Family Medicine

## 2023-03-11 ENCOUNTER — Other Ambulatory Visit: Payer: Self-pay | Admitting: Family Medicine

## 2023-04-05 ENCOUNTER — Encounter: Payer: Self-pay | Admitting: Family Medicine

## 2023-04-05 ENCOUNTER — Ambulatory Visit (INDEPENDENT_AMBULATORY_CARE_PROVIDER_SITE_OTHER): Admitting: Family Medicine

## 2023-04-05 VITALS — BP 118/80 | HR 90 | Temp 98.2°F | Ht 60.24 in | Wt 159.2 lb

## 2023-04-05 DIAGNOSIS — Z0000 Encounter for general adult medical examination without abnormal findings: Secondary | ICD-10-CM | POA: Diagnosis not present

## 2023-04-05 LAB — HEPATIC FUNCTION PANEL
ALT: 29 U/L (ref 0–53)
AST: 25 U/L (ref 0–37)
Albumin: 4.1 g/dL (ref 3.5–5.2)
Alkaline Phosphatase: 63 U/L (ref 39–117)
Bilirubin, Direct: 0.1 mg/dL (ref 0.0–0.3)
Total Bilirubin: 0.6 mg/dL (ref 0.2–1.2)
Total Protein: 6.9 g/dL (ref 6.0–8.3)

## 2023-04-05 LAB — CBC WITH DIFFERENTIAL/PLATELET
Basophils Absolute: 0 10*3/uL (ref 0.0–0.1)
Basophils Relative: 0.5 % (ref 0.0–3.0)
Eosinophils Absolute: 0.1 10*3/uL (ref 0.0–0.7)
Eosinophils Relative: 1.9 % (ref 0.0–5.0)
HCT: 44 % (ref 39.0–52.0)
Hemoglobin: 14.4 g/dL (ref 13.0–17.0)
Lymphocytes Relative: 35.3 % (ref 12.0–46.0)
Lymphs Abs: 2.1 10*3/uL (ref 0.7–4.0)
MCHC: 32.6 g/dL (ref 30.0–36.0)
MCV: 87.9 fl (ref 78.0–100.0)
Monocytes Absolute: 0.5 10*3/uL (ref 0.1–1.0)
Monocytes Relative: 8.7 % (ref 3.0–12.0)
Neutro Abs: 3.2 10*3/uL (ref 1.4–7.7)
Neutrophils Relative %: 53.6 % (ref 43.0–77.0)
Platelets: 242 10*3/uL (ref 150.0–400.0)
RBC: 5.01 Mil/uL (ref 4.22–5.81)
RDW: 13.6 % (ref 11.5–15.5)
WBC: 6 10*3/uL (ref 4.0–10.5)

## 2023-04-05 LAB — BASIC METABOLIC PANEL
BUN: 10 mg/dL (ref 6–23)
CO2: 22 meq/L (ref 19–32)
Calcium: 8.8 mg/dL (ref 8.4–10.5)
Chloride: 106 meq/L (ref 96–112)
Creatinine, Ser: 1 mg/dL (ref 0.40–1.50)
GFR: 84.01 mL/min (ref >60.00)
Glucose, Bld: 99 mg/dL (ref 70–99)
Potassium: 4.6 meq/L (ref 3.5–5.1)
Sodium: 138 meq/L (ref 135–145)

## 2023-04-05 LAB — LIPID PANEL
Cholesterol: 277 mg/dL — ABNORMAL HIGH (ref 0–200)
HDL: 54 mg/dL (ref >39.00)
LDL Cholesterol: 195 mg/dL — ABNORMAL HIGH (ref 0–99)
NonHDL: 222.94
Total CHOL/HDL Ratio: 5
Triglycerides: 139 mg/dL (ref 0.0–149.0)
VLDL: 27.8 mg/dL (ref 0.0–40.0)

## 2023-04-05 LAB — HEMOGLOBIN A1C: Hgb A1c MFr Bld: 6.1 % (ref 4.6–6.5)

## 2023-04-05 LAB — PSA: PSA: 3.08 ng/mL (ref 0.10–4.00)

## 2023-04-05 NOTE — Progress Notes (Signed)
 Established Patient Office Visit  Subjective   Patient ID: DREUX MCGROARTY, male    DOB: January 23, 1965  Age: 58 y.o. MRN: [id number]  Chief Complaint  Patient presents with   Annual Exam    HPI   Curtis Franklin is seen today for physical exam.  He has history of obstructive sleep apnea, hyperlipidemia, BPH.  Ongoing difficulties with urine flow.  He takes several medications per urology including tamsulosin, finasteride, and oxybutynin but still having some flow issues.  He actually has follow-up with him later today.  Has had mildly elevated PSAs in the past.  Denies any recent burning with urination.  Has not been exercising much recently and knows he needs to get back started with that.  He had MRI scan last year which showed some hepatic steatosis.  Health maintenance reviewed:  Health Maintenance  Topic Date Due   HIV Screening  Never done   COVID-19 Vaccine (3 - 2024-25 season) 09/16/2022   INFLUENZA VACCINE  04/15/2023 (Originally 08/16/2022)   DTaP/Tdap/Td (3 - Td or Tdap) 12/25/2023   Colonoscopy  03/23/2027   Hepatitis C Screening  Completed   Zoster Vaccines- Shingrix  Completed   HPV VACCINES  Aged Out   -He is due for tetanus this year and prefers to get this later on.  We discussed newer guidelines for pneumonia vaccination after 50 and he will consider when he gets tetanus booster. -Colonoscopy due April 10, 2027  Social history-he is married.  Works for [employer] as a Quarry manager.  He has 2 daughters.  His younger daughter is third-year medical student [employer] [place] and other daughter is attending medical school in [place].  He quit smoking 10-Apr-2003  Family history-Father died of dementia complications around age 74.  Mother is alive and has history of hypertension.  He has a sister with no active medical problems.  Maternal grandfather with diabetes.  His mom is still alive and lives in [place]  Past Medical History:  Diagnosis Date   HYPERLIPIDEMIA 09/12/2006    Qualifier: Diagnosis of  By: Tawanna Cooler RN, Ellen     Prostatitis, acute 06/18/2011   Past Surgical History:  Procedure Laterality Date   NO PAST SURGERIES      reports that he quit smoking about 20 years ago. His smoking use included cigarettes. He has never used smokeless tobacco. He reports current alcohol use of about 2.0 standard drinks of alcohol per week. He reports that he does not use drugs. family history includes Dementia in his father; Diabetes in his maternal grandfather; Hyperlipidemia in an other family member; Hypertension in his mother; Hypothyroidism in his mother. No Known Allergies   Review of Systems  Constitutional:  Negative for chills, fever, malaise/fatigue and weight loss.  HENT:  Negative for hearing loss.   Eyes:  Negative for blurred vision and double vision.  Respiratory:  Negative for cough and shortness of breath.   Cardiovascular:  Negative for chest pain, palpitations and leg swelling.  Gastrointestinal:  Negative for abdominal pain, blood in stool, constipation and diarrhea.  Genitourinary:  Negative for hematuria.       Still having some slow stream issues and plans to see urologist later today to discuss that further  Skin:  Negative for rash.  Neurological:  Negative for dizziness, speech change, seizures, loss of consciousness and headaches.  Psychiatric/Behavioral:  Negative for depression.       Objective:     BP 118/80 (BP Location: Left Arm, Patient Position: Sitting, Cuff Size: Normal)  Pulse 90   Temp 98.2 F (36.8 C) (Oral)   Ht 5' 0.24" (1.53 m)   Wt 159 lb 3.2 oz (72.2 kg)   SpO2 97%   BMI 30.85 kg/m  BP Readings from Last 3 Encounters:  04/05/23 118/80  06/22/22 126/70  06/12/22 120/70   Wt Readings from Last 3 Encounters:  04/05/23 159 lb 3.2 oz (72.2 kg)  06/22/22 153 lb 14.4 oz (69.8 kg)  06/12/22 154 lb 12.8 oz (70.2 kg)      Physical Exam Vitals reviewed.  Constitutional:      General: He is not in acute  distress.    Appearance: He is well-developed. He is not ill-appearing.  HENT:     Right Ear: External ear normal.     Left Ear: External ear normal.  Eyes:     Pupils: Pupils are equal, round, and reactive to light.  Neck:     Thyroid: No thyromegaly.  Cardiovascular:     Rate and Rhythm: Normal rate and regular rhythm.  Pulmonary:     Effort: Pulmonary effort is normal. No respiratory distress.     Breath sounds: Normal breath sounds. No wheezing or rales.  Musculoskeletal:     Cervical back: Neck supple.     Right lower leg: No edema.     Left lower leg: No edema.  Neurological:     Mental Status: He is alert and oriented to person, place, and time.  Psychiatric:        Mood and Affect: Mood normal.      No results found for any visits on 04/05/23.    The 10-year ASCVD risk score (Arnett DK, et al., 2019) is: 7.1%    Assessment & Plan:   Problem List Items Addressed This Visit   None Visit Diagnoses       Physical exam    -  Primary   Relevant Orders   Basic metabolic panel   Lipid panel   CBC with Differential/Platelet   Hepatic function panel   PSA   Hemoglobin A52c     58 year old male with history of obstructive sleep apnea, hepatic steatosis on recent MRI scan, BPH.  We discussed the following health maintenance items  -Discussed significance of fatty liver.  We strongly recommend try to lose some weight and establish more consistent exercise.  Consider carbohydrate and calorie tracking device such as my fitness pal. -Obtain follow-up labs as above -Recommend he consider Prevnar 20 and also tetanus booster he will consider getting these later this fall.  He declines today. -Consider annual flu vaccine -Colonoscopy up-to-date as above  No follow-ups on file.    Evelena Peat, MD

## 2023-04-05 NOTE — Patient Instructions (Signed)
 Consider Prevnar 20 and tetanus booster at some point this year.

## 2023-04-12 ENCOUNTER — Other Ambulatory Visit: Payer: Self-pay | Admitting: Urology

## 2023-04-12 DIAGNOSIS — R972 Elevated prostate specific antigen [PSA]: Secondary | ICD-10-CM

## 2023-04-23 ENCOUNTER — Encounter: Payer: Self-pay | Admitting: Urology

## 2023-05-08 ENCOUNTER — Other Ambulatory Visit

## 2023-05-10 ENCOUNTER — Ambulatory Visit
Admission: RE | Admit: 2023-05-10 | Discharge: 2023-05-10 | Disposition: A | Discharge status: Home / Self Care | Source: Ambulatory Visit | Attending: Urology | Admitting: Urology

## 2023-05-10 DIAGNOSIS — R972 Elevated prostate specific antigen [PSA]: Secondary | ICD-10-CM

## 2023-05-10 MED ORDER — GADOPICLENOL 0.5 MMOL/ML IV SOLN
7.5000 mL | Freq: Once | INTRAVENOUS | Status: AC | PRN
Start: 2023-05-10 — End: 2023-05-10
  Administered 2023-05-10: 7.5 mL via INTRAVENOUS

## 2023-08-30 ENCOUNTER — Other Ambulatory Visit: Payer: Self-pay | Admitting: Family Medicine

## 2024-02-21 ENCOUNTER — Other Ambulatory Visit: Payer: Self-pay | Admitting: Family Medicine
